# Patient Record
Sex: Female | Born: 1980 | Race: Black or African American | Hispanic: No | Marital: Single | Smoking: Current every day smoker
Health system: Southern US, Community
[De-identification: ages and names within clinical notes are randomized; demographics above are authoritative.]

## PROBLEM LIST (undated history)

## (undated) DIAGNOSIS — N912 Amenorrhea, unspecified: Secondary | ICD-10-CM

## (undated) DIAGNOSIS — N200 Calculus of kidney: Secondary | ICD-10-CM

## (undated) DIAGNOSIS — IMO0002 Reserved for concepts with insufficient information to code with codable children: Secondary | ICD-10-CM

---

## 2012-12-24 ENCOUNTER — Other Ambulatory Visit (HOSPITAL_COMMUNITY)
Admission: RE | Admit: 2012-12-24 | Discharge: 2012-12-24 | Disposition: A | Discharge status: Home / Self Care | Payer: Medicaid Other | Source: Ambulatory Visit | Attending: Family Medicine | Admitting: Family Medicine

## 2012-12-24 ENCOUNTER — Encounter (HOSPITAL_COMMUNITY): Payer: Self-pay | Admitting: *Deleted

## 2012-12-24 ENCOUNTER — Emergency Department (HOSPITAL_COMMUNITY)
Admission: EM | Admit: 2012-12-24 | Discharge: 2012-12-24 | Disposition: A | Discharge status: Home / Self Care | Payer: Medicaid Other | Source: Home / Self Care

## 2012-12-24 DIAGNOSIS — Z113 Encounter for screening for infections with a predominantly sexual mode of transmission: Secondary | ICD-10-CM | POA: Insufficient documentation

## 2012-12-24 DIAGNOSIS — N760 Acute vaginitis: Secondary | ICD-10-CM | POA: Insufficient documentation

## 2012-12-24 DIAGNOSIS — R109 Unspecified abdominal pain: Secondary | ICD-10-CM

## 2012-12-24 LAB — POCT URINALYSIS DIP (DEVICE)
Ketones, ur: NEGATIVE mg/dL
Protein, ur: NEGATIVE mg/dL
Specific Gravity, Urine: 1.02 (ref 1.005–1.030)
pH: 5.5 (ref 5.0–8.0)

## 2012-12-24 LAB — POCT PREGNANCY, URINE: Preg Test, Ur: NEGATIVE

## 2012-12-24 MED ORDER — FLUCONAZOLE 150 MG PO TABS: 150.0000 mg | ORAL_TABLET | Freq: Once | ORAL | Status: DC

## 2012-12-24 NOTE — ED Notes (Signed)
C/O intermittent low abd pains since last night.  Denies any n/v, vaginal discharge.  Has been intermittently taking amoxicillin over past 2 wks for dental infection.  Had diarrhea on Sat, but resolved.  Has been taking 800mg  IBU.

## 2012-12-24 NOTE — ED Provider Notes (Signed)
Suzanne Rush is a 32 y.o. female who presents to Urgent Care today for abdominal pain present since the last several days associated with some vaginal irritation and mild vaginal discharge. Patient recently has been taking amoxicillin for a dental infection. She is scheduled to have to surgery on Wednesday. She notes one day of mild diarrhea on Sunday. She's tried ibuprofen which has helped a bit. She denies any nausea or vomiting or constipation or blood in her stool. She notes that it has been sometime since her last menstrual period however she has been on Depo-Provera until recently. No fevers or chills. No sick contacts.    PMH reviewed. Healthy otherwise History  Substance Use Topics  . Smoking status: Never Smoker   . Smokeless tobacco: Not on file  . Alcohol Use: Yes     Comment: occasional   ROS as above Medications reviewed. No current facility-administered medications for this encounter.   Current Outpatient Prescriptions  Medication Sig Dispense Refill  . fluconazole (DIFLUCAN) 150 MG tablet Take 1 tablet (150 mg total) by mouth once.  1 tablet  1    Exam:  BP 118/68  Pulse 88  Temp(Src) 98.3 F (36.8 C) (Oral)  Resp 16  SpO2 95% Gen: Well NAD HEENT: EOMI,  MMM Lungs: CTABL Nl WOB Heart: RRR no MRG Abd: NABS, nondistended mildly tender left lower quadrant.  Exts: Non edematous BL  LE, warm and well perfused.  GYN: Slightly irritated external genitalia with white occasional discharge.  Vaginal canal with thick cheesy discharge.  Normal-appearing cervix.  No cervical motion tenderness no adnexal masses  Results for orders placed during the hospital encounter of 12/24/12 (from the past 24 hour(s))  POCT URINALYSIS DIP (DEVICE)     Status: Abnormal   Collection Time    12/24/12  7:04 PM      Result Value Range   Glucose, UA NEGATIVE  NEGATIVE mg/dL   Bilirubin Urine NEGATIVE  NEGATIVE   Ketones, ur NEGATIVE  NEGATIVE mg/dL   Specific Gravity, Urine 1.020  1.005  - 1.030   Hgb urine dipstick SMALL (*) NEGATIVE   pH 5.5  5.0 - 8.0   Protein, ur NEGATIVE  NEGATIVE mg/dL   Urobilinogen, UA 0.2  0.0 - 1.0 mg/dL   Nitrite NEGATIVE  NEGATIVE   Leukocytes, UA NEGATIVE  NEGATIVE  POCT PREGNANCY, URINE     Status: None   Collection Time    12/24/12  7:08 PM      Result Value Range   Preg Test, Ur NEGATIVE  NEGATIVE   No results found.  Assessment and Plan: 32 y.o. female with likely yeast infection.  Plan to treat with fluconazole. Additionally diarrhea may be due to amoxicillin.. Advised patient to discontinue amoxicillin is possible take probiotics.  Followup as needed.  Discussed warning signs or symptoms. Please see discharge instructions. Patient expresses understanding.      Rodolph Bong, MD 12/24/12 [phone]

## 2012-12-26 NOTE — ED Notes (Signed)
GC/Chlamydia neg., Affirm: Gardnerella pos., Candida and Trich neg.  Message sent to Dr. Denyse Amass. Vassie Moselle 12/26/2012

## 2012-12-31 ENCOUNTER — Telehealth (HOSPITAL_COMMUNITY): Payer: Self-pay | Admitting: Family Medicine

## 2012-12-31 MED ORDER — METRONIDAZOLE 500 MG PO TABS: 500.0000 mg | ORAL_TABLET | Freq: Two times a day (BID) | ORAL | Status: DC

## 2012-12-31 NOTE — ED Notes (Signed)
Pt. called back. Pt. verified x 2 and given results.  Pt. told she needs Flagyl for bacterial vaginosis.   Pt. instructed to no alcohol while taking this medication.  Pt. told she can pick it up at her pharmacy.  Pt. voiced understanding. Vassie Moselle 12/31/2012

## 2012-12-31 NOTE — ED Notes (Signed)
Called in flagyl.  Nurse will call patient.   Rodolph Bong, MD 12/31/12 [phone]

## 2012-12-31 NOTE — ED Notes (Signed)
Flagyl e-prescribed by Dr. Denyse Amass to [facility] on [address]  I called pt. and left a message to call Vassie Moselle 12/31/2012

## 2012-12-31 NOTE — Telephone Encounter (Signed)
Message copied by Rodolph Bong on Mon Dec 31, 2012  2:56 PM ------      Message from: Vassie Moselle      Created: Wed Dec 26, 2012  9:43 PM      Regarding: labs       Gardnerella pos.  Rest of labs neg.  Treated with Diflucan.      Vassie Moselle      12/26/2012       ------

## 2013-05-20 ENCOUNTER — Emergency Department (HOSPITAL_COMMUNITY)
Admission: EM | Admit: 2013-05-20 | Discharge: 2013-05-20 | Disposition: A | Discharge status: Home / Self Care | Payer: Medicaid Other | Source: Home / Self Care | Attending: Family Medicine | Admitting: Family Medicine

## 2013-05-20 ENCOUNTER — Encounter (HOSPITAL_COMMUNITY): Payer: Self-pay | Admitting: Emergency Medicine

## 2013-05-20 DIAGNOSIS — N200 Calculus of kidney: Secondary | ICD-10-CM

## 2013-05-20 LAB — POCT URINALYSIS DIP (DEVICE)
Ketones, ur: NEGATIVE mg/dL
Protein, ur: 100 mg/dL — AB
Specific Gravity, Urine: 1.015 (ref 1.005–1.030)

## 2013-05-20 LAB — POCT PREGNANCY, URINE: Preg Test, Ur: NEGATIVE

## 2013-05-20 MED ORDER — HYDROCODONE-ACETAMINOPHEN 5-325 MG PO TABS: 1.0000 | ORAL_TABLET | Freq: Four times a day (QID) | ORAL | Status: DC | PRN

## 2013-05-20 MED ORDER — CEPHALEXIN 500 MG PO CAPS: 500.0000 mg | ORAL_CAPSULE | Freq: Four times a day (QID) | ORAL | Status: DC

## 2013-05-20 MED ORDER — NAPROXEN 500 MG PO TABS: 500.0000 mg | ORAL_TABLET | Freq: Two times a day (BID) | ORAL | Status: DC | PRN

## 2013-05-20 MED ORDER — TAMSULOSIN HCL 0.4 MG PO CAPS: 0.4000 mg | ORAL_CAPSULE | Freq: Every day | ORAL | Status: DC

## 2013-05-20 NOTE — ED Notes (Signed)
Pt c/o intermittent lower back pain that radiate to bilateral sides... Pain increases w/acitivty  sxs also include: dysuria Denies: f/v/n/d, hematuria, gyn sxs She is alert w/no signs of acute distress.

## 2013-05-20 NOTE — ED Provider Notes (Signed)
Suzanne Rush is a 32 y.o. female who presents to Urgent Care today for Right flank pain starting yesterday. Patient notes moderate to severe right-sided with very mild left-sided flank pain. The pain radiates from her right flank to her right groin. She notes some urgency and frequency. She denies any nausea vomiting or diarrhea. She denies any vaginal discharge or bleeding.she has not tried any medications yet. The pain comes and goes and is severe at times. She notes sweating but denies any fever.   History reviewed. No pertinent past medical history. History  Substance Use Topics  . Smoking status: Never Smoker   . Smokeless tobacco: Not on file  . Alcohol Use: Yes     Comment: occasional   ROS as above Medications reviewed. No current facility-administered medications for this encounter.   Current Outpatient Prescriptions  Medication Sig Dispense Refill  . cephALEXin (KEFLEX) 500 MG capsule Take 1 capsule (500 mg total) by mouth 4 (four) times daily.  40 capsule  0  . HYDROcodone-acetaminophen (NORCO/VICODIN) 5-325 MG per tablet Take 1 tablet by mouth every 6 (six) hours as needed.  15 tablet  0  . naproxen (NAPROSYN) 500 MG tablet Take 1 tablet (500 mg total) by mouth 2 (two) times daily as needed for moderate pain.  30 tablet  0  . tamsulosin (FLOMAX) 0.4 MG CAPS capsule Take 1 capsule (0.4 mg total) by mouth daily.  30 capsule  0    Exam:  BP 113/76  Pulse 117  Temp(Src) 99.4 F (37.4 C) (Oral)  Resp 16  SpO2 100% Gen: Well NAD HEENT: EOMI,  MMM Lungs: Normal work of breathing. CTABL Heart: RRR no MRG Abd: NABS, Soft. NT, ND, no significant CVA tenderness to percussion Exts: Non edematous BL  LE, warm and well perfused.   Results for orders placed during the hospital encounter of 05/20/13 (from the past 24 hour(s))  POCT URINALYSIS DIP (DEVICE)     Status: Abnormal   Collection Time    05/20/13  8:52 AM      Result Value Range   Glucose, UA NEGATIVE  NEGATIVE mg/dL   Bilirubin Urine NEGATIVE  NEGATIVE   Ketones, ur NEGATIVE  NEGATIVE mg/dL   Specific Gravity, Urine 1.015  1.005 - 1.030   Hgb urine dipstick MODERATE (*) NEGATIVE   pH 5.5  5.0 - 8.0   Protein, ur 100 (*) NEGATIVE mg/dL   Urobilinogen, UA 0.2  0.0 - 1.0 mg/dL   Nitrite NEGATIVE  NEGATIVE   Leukocytes, UA SMALL (*) NEGATIVE  POCT PREGNANCY, URINE     Status: None   Collection Time    05/20/13  8:53 AM      Result Value Range   Preg Test, Ur NEGATIVE  NEGATIVE   No results found.  Assessment and Plan: 32 y.o. female with probable kidney stone. Plan for treatment with naproxen, Flomax, Norco, and Keflex. Urine culture is pending. Will present to the emergency room if worsening. Will followup with urology. Discussed warning signs or symptoms. Please see discharge instructions. Patient expresses understanding.      Rodolph Bong, MD 05/20/13 [phone]

## 2013-05-20 NOTE — ED Notes (Signed)
Urine strainer given to pt.

## 2013-05-22 LAB — URINE CULTURE: Special Requests: NORMAL

## 2013-05-22 NOTE — ED Notes (Signed)
Urine culture: >100,000 colonies E. Coli.  Pt. adequately treated with Keflex. Suzanne Rush M 05/22/2013  

## 2013-05-31 ENCOUNTER — Emergency Department (HOSPITAL_COMMUNITY)
Admission: EM | Admit: 2013-05-31 | Discharge: 2013-05-31 | Disposition: A | Discharge status: Home / Self Care | Payer: Medicaid Other | Source: Home / Self Care

## 2013-05-31 ENCOUNTER — Encounter (HOSPITAL_COMMUNITY): Payer: Self-pay | Admitting: Emergency Medicine

## 2013-05-31 DIAGNOSIS — R071 Chest pain on breathing: Secondary | ICD-10-CM

## 2013-05-31 DIAGNOSIS — M940 Chondrocostal junction syndrome [Tietze]: Secondary | ICD-10-CM

## 2013-05-31 DIAGNOSIS — R0789 Other chest pain: Secondary | ICD-10-CM

## 2013-05-31 HISTORY — DX: Calculus of kidney: N20.0

## 2013-05-31 MED ORDER — TRAMADOL HCL 50 MG PO TABS: 50.0000 mg | ORAL_TABLET | Freq: Four times a day (QID) | ORAL | Status: DC | PRN

## 2013-05-31 MED ORDER — DICLOFENAC POTASSIUM 50 MG PO TABS: 50.0000 mg | ORAL_TABLET | Freq: Three times a day (TID) | ORAL | Status: DC

## 2013-05-31 NOTE — Discharge Instructions (Signed)
Chest Wall Pain Chest wall pain is pain in or around the bones and muscles of your chest. It may take up to 6 weeks to get better. It may take longer if you must stay physically active in your work and activities.  CAUSES  Chest wall pain may happen on its own. However, it may be caused by:  A viral illness like the flu.  Injury.  Coughing.  Exercise.  Arthritis.  Fibromyalgia.  Shingles. HOME CARE INSTRUCTIONS   Avoid overtiring physical activity. Try not to strain or perform activities that cause pain. This includes any activities using your chest or your abdominal and side muscles, especially if heavy weights are used.  Put ice on the sore area.  Put ice in a plastic bag.  Place a towel between your skin and the bag.  Leave the ice on for 15-20 minutes per hour while awake for the first 2 days.  Only take over-the-counter or prescription medicines for pain, discomfort, or fever as directed by your caregiver. SEEK IMMEDIATE MEDICAL CARE IF:   Your pain increases, or you are very uncomfortable.  You have a fever.  Your chest pain becomes worse.  You have new, unexplained symptoms.  You have nausea or vomiting.  You feel sweaty or lightheaded.  You have a cough with phlegm (sputum), or you cough up blood. MAKE SURE YOU:   Understand these instructions.  Will watch your condition.  Will get help right away if you are not doing well or get worse. Document Released: 05/09/2005 Document Revised: 08/01/2011 Document Reviewed: 01/03/2011 [facility] Patient Information 2014 [place], [place].  Costochondritis Costochondritis (Tietze syndrome), or costochondral separation, is a swelling and irritation (inflammation) of the tissue (cartilage) that connects your ribs with your breastbone (sternum). It may occur on its own (spontaneously), through damage caused by an accident (trauma), or simply from coughing or minor exercise. It may take up to 6 weeks to get better and  longer if you are unable to be conservative in your activities. HOME CARE INSTRUCTIONS   Avoid exhausting physical activity. Try not to strain your ribs during normal activity. This would include any activities using chest, belly (abdominal), and side muscles, especially if heavy weights are used.  Use ice for 15-20 minutes per hour while awake for the first 2 days. Place the ice in a plastic bag, and place a towel between the bag of ice and your skin.  Only take over-the-counter or prescription medicines for pain, discomfort, or fever as directed by your caregiver. SEEK IMMEDIATE MEDICAL CARE IF:   Your pain increases or you are very uncomfortable.  You have a fever.  You develop difficulty with your breathing.  You cough up blood.  You develop worse chest pains, shortness of breath, sweating, or vomiting.  You develop new, unexplained problems (symptoms). MAKE SURE YOU:   Understand these instructions.  Will watch your condition.  Will get help right away if you are not doing well or get worse. Document Released: 02/16/2005 Document Revised: 08/01/2011 Document Reviewed: 12/11/2012 [facility] Patient Information 2014 [place], [place].

## 2013-05-31 NOTE — ED Notes (Signed)
states she began to have chest pain at work aprox  7:30 Am . Pain worse w direct palpation to chest wall, worse w deep breathing; NAD at present; "under a lot of stress"

## 2013-05-31 NOTE — ED Provider Notes (Signed)
CSN: [id number]     Arrival date & time 05/31/13  0902 History   First MD Initiated Contact with Patient 05/31/13 0919     Chief Complaint  Patient presents with  . Chest Pain   (Consider location/radiation/quality/duration/timing/severity/associated sxs/prior Treatment) HPI Comments: 33 year old female states she was at work today standing in performing her usual duties when she experienced acute right-sided chest pain. She knows that the pain was worse with movement and taking a deep breath.   Past Medical History  Diagnosis Date  . Kidney stones    Past Surgical History  Procedure Laterality Date  . Cesarean section     History reviewed. No pertinent family history. History  Substance Use Topics  . Smoking status: Current Every Day Smoker  . Smokeless tobacco: Not on file  . Alcohol Use: Yes     Comment: occasional   OB History   Grav Para Term Preterm Abortions TAB SAB Ect Mult Living                 Review of Systems  Constitutional: Positive for activity change. Negative for fever, chills and diaphoresis.  HENT: Negative.   Respiratory: Negative.  Negative for shortness of breath and wheezing.   Cardiovascular: Positive for chest pain and palpitations. Negative for leg swelling.  Gastrointestinal: Negative.   Musculoskeletal: Negative.   Skin: Negative.     Allergies  Review of patient's allergies indicates no known allergies.  Home Medications   Current Outpatient Rx  Name  Route  Sig  Dispense  Refill  . cephALEXin (KEFLEX) 500 MG capsule   Oral   Take 1 capsule (500 mg total) by mouth 4 (four) times daily.   40 capsule   0   . diclofenac (CATAFLAM) 50 MG tablet   Oral   Take 1 tablet (50 mg total) by mouth 3 (three) times daily.   21 tablet   0   . HYDROcodone-acetaminophen (NORCO/VICODIN) 5-325 MG per tablet   Oral   Take 1 tablet by mouth every 6 (six) hours as needed.   15 tablet   0   . naproxen (NAPROSYN) 500 MG tablet   Oral   Take 1  tablet (500 mg total) by mouth 2 (two) times daily as needed for moderate pain.   30 tablet   0   . tamsulosin (FLOMAX) 0.4 MG CAPS capsule   Oral   Take 1 capsule (0.4 mg total) by mouth daily.   30 capsule   0   . traMADol (ULTRAM) 50 MG tablet   Oral   Take 1 tablet (50 mg total) by mouth every 6 (six) hours as needed.   15 tablet   0    BP 121/78  Pulse 87  Temp(Src) 98.1 F (36.7 C) (Oral)  Resp 14  SpO2 100% Physical Exam  Nursing note and vitals reviewed. Constitutional: She is oriented to person, place, and time. She appears well-developed and well-nourished. No distress.  obese  Neck: Normal range of motion. Neck supple.  Cardiovascular: Normal rate, regular rhythm and normal heart sounds.   No murmur heard. Pulmonary/Chest: Effort normal and breath sounds normal. No respiratory distress. She has no wheezes. She has no rales.  Moderate to severe tendrness upon palpating the R parasternal border and R upper anterior chest. This reproduces exact same pain for which she presented. Test perform multiple times with same outcome.  Abdominal: Soft. There is no tenderness.  Musculoskeletal: Normal range of motion. She exhibits tenderness. She  exhibits no edema.  Lymphadenopathy:    She has no cervical adenopathy.  Neurological: She is alert and oriented to person, place, and time. She exhibits normal muscle tone.  Skin: Skin is warm and dry.  Psychiatric: She has a normal mood and affect.    ED Course  Procedures (including critical care time) Labs Review Labs Reviewed - No data to display Imaging Review  EKG: NSR, no ectopy, S-T elevation in II, III and precordial leads consistent with early repol. Examined by Dr. Artis FlockKindl.  MDM   1. Chest wall pain   2. Costochondritis, acute    Diclofenac as dir Tramadol as dir Apply ice locally prn F/U with your PCP      Hayden Rasmussenavid Miciah Covelli, NP 05/31/13 1006  Hayden Rasmussenavid Joaopedro Eschbach, NP 05/31/13 1017

## 2013-06-03 NOTE — ED Provider Notes (Signed)
Medical screening examination/treatment/procedure(s) were performed by resident physician or non-physician practitioner and as supervising physician I was immediately available for consultation/collaboration.   Barkley BrunsKINDL,Kinberly Perris DOUGLAS MD.   Linna HoffJames D Ahtziri Jeffries, MD 06/03/13 [phone]

## 2013-09-29 ENCOUNTER — Emergency Department (INDEPENDENT_AMBULATORY_CARE_PROVIDER_SITE_OTHER)
Admission: EM | Admit: 2013-09-29 | Discharge: 2013-09-29 | Disposition: A | Discharge status: Home / Self Care | Payer: Self-pay | Source: Home / Self Care | Attending: Emergency Medicine | Admitting: Emergency Medicine

## 2013-09-29 ENCOUNTER — Encounter (HOSPITAL_COMMUNITY): Payer: Self-pay | Admitting: Emergency Medicine

## 2013-09-29 DIAGNOSIS — N912 Amenorrhea, unspecified: Secondary | ICD-10-CM

## 2013-09-29 DIAGNOSIS — R112 Nausea with vomiting, unspecified: Secondary | ICD-10-CM

## 2013-09-29 DIAGNOSIS — N390 Urinary tract infection, site not specified: Secondary | ICD-10-CM

## 2013-09-29 HISTORY — DX: Reserved for concepts with insufficient information to code with codable children: IMO0002

## 2013-09-29 HISTORY — DX: Amenorrhea, unspecified: N91.2

## 2013-09-29 LAB — CBC WITH DIFFERENTIAL/PLATELET
BASOS ABS: 0 10*3/uL (ref 0.0–0.1)
Basophils Relative: 0 % (ref 0–1)
Eosinophils Absolute: 0.1 10*3/uL (ref 0.0–0.7)
Eosinophils Relative: 1 % (ref 0–5)
HEMATOCRIT: 35.7 % — AB (ref 36.0–46.0)
HEMOGLOBIN: 12.2 g/dL (ref 12.0–15.0)
LYMPHS PCT: 30 % (ref 12–46)
Lymphs Abs: 2 10*3/uL (ref 0.7–4.0)
MCH: 30.3 pg (ref 26.0–34.0)
MCHC: 34.2 g/dL (ref 30.0–36.0)
MCV: 88.6 fL (ref 78.0–100.0)
MONO ABS: 0.3 10*3/uL (ref 0.1–1.0)
Monocytes Relative: 5 % (ref 3–12)
NEUTROS PCT: 64 % (ref 43–77)
Neutro Abs: 4.3 10*3/uL (ref 1.7–7.7)
Platelets: 285 10*3/uL (ref 150–400)
RBC: 4.03 MIL/uL (ref 3.87–5.11)
RDW: 13.4 % (ref 11.5–15.5)
WBC: 6.7 10*3/uL (ref 4.0–10.5)

## 2013-09-29 LAB — POCT URINALYSIS DIP (DEVICE)
BILIRUBIN URINE: NEGATIVE
GLUCOSE, UA: NEGATIVE mg/dL
KETONES UR: NEGATIVE mg/dL
Nitrite: POSITIVE — AB
PH: 6 (ref 5.0–8.0)
Protein, ur: NEGATIVE mg/dL
Specific Gravity, Urine: 1.01 (ref 1.005–1.030)
Urobilinogen, UA: 0.2 mg/dL (ref 0.0–1.0)

## 2013-09-29 LAB — POCT I-STAT, CHEM 8
BUN: 7 mg/dL (ref 6–23)
CALCIUM ION: 1.18 mmol/L (ref 1.12–1.23)
CREATININE: 0.8 mg/dL (ref 0.50–1.10)
Chloride: 105 mEq/L (ref 96–112)
Glucose, Bld: 113 mg/dL — ABNORMAL HIGH (ref 70–99)
HCT: 41 % (ref 36.0–46.0)
Hemoglobin: 13.9 g/dL (ref 12.0–15.0)
Potassium: 3.5 mEq/L — ABNORMAL LOW (ref 3.7–5.3)
Sodium: 146 mEq/L (ref 137–147)
TCO2: 27 mmol/L (ref 0–100)

## 2013-09-29 LAB — POCT PREGNANCY, URINE: PREG TEST UR: NEGATIVE

## 2013-09-29 LAB — POCT H PYLORI SCREEN: H. PYLORI SCREEN, POC: NEGATIVE

## 2013-09-29 LAB — TSH: TSH: 0.885 u[IU]/mL (ref 0.350–4.500)

## 2013-09-29 MED ORDER — METOCLOPRAMIDE HCL 10 MG PO TABS: 10.0000 mg | ORAL_TABLET | Freq: Three times a day (TID) | ORAL | Status: DC

## 2013-09-29 MED ORDER — RANITIDINE HCL 150 MG PO TABS: 150.0000 mg | ORAL_TABLET | Freq: Two times a day (BID) | ORAL | Status: DC

## 2013-09-29 MED ORDER — CEPHALEXIN 500 MG PO CAPS: 500.0000 mg | ORAL_CAPSULE | Freq: Three times a day (TID) | ORAL | Status: DC

## 2013-09-29 MED ORDER — FLUCONAZOLE 150 MG PO TABS: 150.0000 mg | ORAL_TABLET | Freq: Once | ORAL | Status: DC

## 2013-09-29 NOTE — Progress Notes (Signed)
Quick Note:  Test result was normal. No further action is needed at this time.Patient was called and informed of result. ______

## 2013-09-29 NOTE — ED Notes (Signed)
Reports started 14-day course Provera 2 wks ago for amenorrhea; pt trying to conceive.  Since Tuesday, has had nausea, vomiting episodes, HAs, frequent urination.  Took home preg test Tues - was negative.

## 2013-09-29 NOTE — Discharge Instructions (Signed)
Nausea and Vomiting Nausea is a sick feeling that often comes before throwing up (vomiting). Vomiting is a reflex where stomach contents come out of your mouth. Vomiting can cause severe loss of body fluids (dehydration). Children and elderly adults can become dehydrated quickly, especially if they also have diarrhea. Nausea and vomiting are symptoms of a condition or disease. It is important to find the cause of your symptoms. CAUSES   Direct irritation of the stomach lining. This irritation can result from increased acid production (gastroesophageal reflux disease), infection, food poisoning, taking certain medicines (such as nonsteroidal anti-inflammatory drugs), alcohol use, or tobacco use.  Signals from the brain.These signals could be caused by a headache, heat exposure, an inner ear disturbance, increased pressure in the brain from injury, infection, a tumor, or a concussion, pain, emotional stimulus, or metabolic problems.  An obstruction in the gastrointestinal tract (bowel obstruction).  Illnesses such as diabetes, hepatitis, gallbladder problems, appendicitis, kidney problems, cancer, sepsis, atypical symptoms of a heart attack, or eating disorders.  Medical treatments such as chemotherapy and radiation.  Receiving medicine that makes you sleep (general anesthetic) during surgery. DIAGNOSIS Your caregiver may ask for tests to be done if the problems do not improve after a few days. Tests may also be done if symptoms are severe or if the reason for the nausea and vomiting is not clear. Tests may include:  Urine tests.  Blood tests.  Stool tests.  Cultures (to look for evidence of infection).  X-rays or other imaging studies. Test results can help your caregiver make decisions about treatment or the need for additional tests. TREATMENT You need to stay well hydrated. Drink frequently but in small amounts.You may wish to drink water, sports drinks, clear broth, or eat frozen  ice pops or gelatin dessert to help stay hydrated.When you eat, eating slowly may help prevent nausea.There are also some antinausea medicines that may help prevent nausea. HOME CARE INSTRUCTIONS   Take all medicine as directed by your caregiver.  If you do not have an appetite, do not force yourself to eat. However, you must continue to drink fluids.  If you have an appetite, eat a normal diet unless your caregiver tells you differently.  Eat a variety of complex carbohydrates (rice, wheat, potatoes, bread), lean meats, yogurt, fruits, and vegetables.  Avoid high-fat foods because they are more difficult to digest.  Drink enough water and fluids to keep your urine clear or pale yellow.  If you are dehydrated, ask your caregiver for specific rehydration instructions. Signs of dehydration may include:  Severe thirst.  Dry lips and mouth.  Dizziness.  Dark urine.  Decreasing urine frequency and amount.  Confusion.  Rapid breathing or pulse. SEEK IMMEDIATE MEDICAL CARE IF:   You have blood or brown flecks (like coffee grounds) in your vomit.  You have black or bloody stools.  You have a severe headache or stiff neck.  You are confused.  You have severe abdominal pain.  You have chest pain or trouble breathing.  You do not urinate at least once every 8 hours.  You develop cold or clammy skin.  You continue to vomit for longer than 24 to 48 hours.  You have a fever. MAKE SURE YOU:   Understand these instructions.  Will watch your condition.  Will get help right away if you are not doing well or get worse. Document Released: 05/09/2005 Document Revised: 08/01/2011 Document Reviewed: 10/06/2010 [facility] Patient Information 2014 [place], [place]. Secondary Amenorrhea  Secondary amenorrhea  is the stopping of menstrual flow for 3 6 months in a female who has previously had periods. There are many possible causes. Most of these causes are not serious. Usually,  treating the underlying problem causing the loss of menses will return your periods to normal. CAUSES  Some common and uncommon causes of not menstruating include:  Malnutrition.  Low blood sugar (hypoglycemia).  Polycystic ovary disease.  Stress or fear.  Breastfeeding.  Hormone imbalance.  Ovarian failure.  Medicines.  Extreme obesity.  Cystic fibrosis.  Low body weight or drastic weight reduction from any cause.  Early menopause.  Removal of ovaries or uterus.  Contraceptives.  Illness.  Long-term (chronic) illnesses.  Cushing syndrome.  Thyroid problems.  Birth control pills, patches, or vaginal rings for birth control. RISK FACTORS You may be at greater risk of secondary amenorrhea if:  You have a family history of this condition.  You have an eating disorder.  You do athletic training. DIAGNOSIS  A diagnosis is made by your health care provider taking a medical history and doing a physical exam. This will include a pelvic exam to check for problems with your reproductive organs. Pregnancy must be ruled out. Often, numerous blood tests are done to measure different hormones in the body. Urine testing may be done. Specialized exams (ultrasound, CT scan, MRI, or hysteroscopy) may have to be done as well as measuring the body mass index (BMI). TREATMENT  Treatment depends on the cause of the amenorrhea. If an eating disorder is present, this can be treated with an adequate diet and therapy. Chronic illnesses may improve with treatment of the illness. Amenorrhea may be corrected with medicines, lifestyle changes, or surgery. If the amenorrhea cannot be corrected, it is sometimes possible to create a false menstruation with medicines. HOME CARE INSTRUCTIONS  Maintain a healthy diet.  Manage weight problems.  Exercise regularly but not excessively.  Get adequate sleep.  Manage stress.  Be aware of changes in your menstrual cycle. Keep a record of when  your periods occur. Note the date your period starts, how long it lasts, and any problems. SEEK MEDICAL CARE IF: Your symptoms do not get better with treatment. Document Released: 06/20/2006 Document Revised: 01/09/2013 Document Reviewed: 10/25/2012 [facility] Patient Information 2014 [place], [place].  Urinary Tract Infection Urinary tract infections (UTIs) can develop anywhere along your urinary tract. Your urinary tract is your body's drainage system for removing wastes and extra water. Your urinary tract includes two kidneys, two ureters, a bladder, and a urethra. Your kidneys are a pair of bean-shaped organs. Each kidney is about the size of your fist. They are located below your ribs, one on each side of your spine. CAUSES Infections are caused by microbes, which are microscopic organisms, including fungi, viruses, and bacteria. These organisms are so small that they can only be seen through a microscope. Bacteria are the microbes that most commonly cause UTIs. SYMPTOMS  Symptoms of UTIs may vary by age and gender of the patient and by the location of the infection. Symptoms in young women typically include a frequent and intense urge to urinate and a painful, burning feeling in the bladder or urethra during urination. Older women and men are more likely to be tired, shaky, and weak and have muscle aches and abdominal pain. A fever may mean the infection is in your kidneys. Other symptoms of a kidney infection include pain in your back or sides below the ribs, nausea, and vomiting. DIAGNOSIS To diagnose a UTI,  your caregiver will ask you about your symptoms. Your caregiver also will ask to provide a urine sample. The urine sample will be tested for bacteria and white blood cells. White blood cells are made by your body to help fight infection. TREATMENT  Typically, UTIs can be treated with medication. Because most UTIs are caused by a bacterial infection, they usually can be treated with the use of  antibiotics. The choice of antibiotic and length of treatment depend on your symptoms and the type of bacteria causing your infection. HOME CARE INSTRUCTIONS  If you were prescribed antibiotics, take them exactly as your caregiver instructs you. Finish the medication even if you feel better after you have only taken some of the medication.  Drink enough water and fluids to keep your urine clear or pale yellow.  Avoid caffeine, tea, and carbonated beverages. They tend to irritate your bladder.  Empty your bladder often. Avoid holding urine for long periods of time.  Empty your bladder before and after sexual intercourse.  After a bowel movement, women should cleanse from front to back. Use each tissue only once. SEEK MEDICAL CARE IF:   You have back pain.  You develop a fever.  Your symptoms do not begin to resolve within 3 days. SEEK IMMEDIATE MEDICAL CARE IF:   You have severe back pain or lower abdominal pain.  You develop chills.  You have nausea or vomiting.  You have continued burning or discomfort with urination. MAKE SURE YOU:   Understand these instructions.  Will watch your condition.  Will get help right away if you are not doing well or get worse. Document Released: 02/16/2005 Document Revised: 11/08/2011 Document Reviewed: 06/17/2011 [facility] Patient Information 2014 [place], [place].

## 2013-09-29 NOTE — ED Provider Notes (Signed)
Chief Complaint   Chief Complaint  Patient presents with  . Emesis  . Headache    History of Present Illness   Aviva SignsShawana Grandmaison is a 33 year old female who has had a six-day history of nausea and vomiting after meals. She gets nauseated with even strong smells of food cooking she has frequent salivation. She's had headaches, but denies any fever or chills. She denies any abdominal pain at all. She's had some urinary frequency but denies urgency, dysuria, or hematuria. She's had no upper lower back pain. She has no history of ulcer disease, gastritis, gallstones, hepatitis, colitis, or diverticulitis.  The patient also has had problems with her menstrual periods. Her last menstrual period was in 2011. She was on Depo-Provera from 2011 through 2013. She stopped the Depo-Provera 2013 and is now trying to become pregnant. She has not had a normal menstrual period since she stopped the Depo-Provera. [place]. He prescribed Provera by mouth for 10 days to get her period started. She finished this up but has not started her period yet. She had a negative pregnancy test a week ago.  Review of Systems   Other than as noted above, the patient denies any of the following symptoms: Constitutional:  No fever, chills, weight loss or anorexia. Abdomen:  No nausea, vomiting, hematememesis, melena, diarrhea, or hematochezia. GU:  No dysuria, frequency, urgency, or hematuria. Gyn:  No vaginal discharge, itching, abnormal bleeding, dyspareunia, or pelvic pain.  PMFSH   Past medical history, family history, social history, meds, and allergies were reviewed.   Physical Exam     Vital signs:  BP 107/82  Pulse 107  Temp(Src) 98.4 F (36.9 C) (Oral)  Resp 16  SpO2 100% Gen:  Alert, oriented, in no distress. Lungs:  Breath sounds clear and equal bilaterally.  No wheezes, rales or rhonchi. Heart:  Regular rhythm.  No gallops or murmers.   Abdomen:   Soft, flat, nondistended. No organomegaly or mass. Bowel sounds were normally active. Skin:  Clear, warm and dry.  No rash.  Labs   Results for orders placed during the hospital encounter of 09/29/13  TSH      Result Value Ref Range   TSH 0.885  0.350 - 4.500 uIU/mL  CBC WITH DIFFERENTIAL      Result Value Ref Range   WBC 6.7  4.0 - 10.5 K/uL   RBC 4.03  3.87 - 5.11 MIL/uL   Hemoglobin 12.2  12.0 - 15.0 g/dL   HCT 69.635.7 (*) 29.536.0 - 28.446.0 %   MCV 88.6  78.0 - 100.0 fL   MCH 30.3  26.0 - 34.0 pg   MCHC 34.2  30.0 - 36.0 g/dL   RDW 13.213.4  44.011.5 - 10.215.5 %   Platelets 285  150 - 400 K/uL   Neutrophils Relative % 64  43 - 77 %   Neutro Abs 4.3  1.7 - 7.7 K/uL   Lymphocytes Relative 30  12 - 46 %   Lymphs Abs 2.0  0.7 - 4.0 K/uL   Monocytes Relative 5  3 - 12 %   Monocytes Absolute 0.3  0.1 - 1.0 K/uL   Eosinophils Relative 1  0 - 5 %   Eosinophils Absolute 0.1  0.0 - 0.7 K/uL   Basophils Relative 0  0 - 1 %   Basophils Absolute 0.0  0.0 - 0.1 K/uL  POCT URINALYSIS DIP (DEVICE)      Result  Value Ref Range   Glucose, UA NEGATIVE  NEGATIVE mg/dL   Bilirubin Urine NEGATIVE  NEGATIVE   Ketones, ur NEGATIVE  NEGATIVE mg/dL   Specific Gravity, Urine 1.010  1.005 - 1.030   Hgb urine dipstick SMALL (*) NEGATIVE   pH 6.0  5.0 - 8.0   Protein, ur NEGATIVE  NEGATIVE mg/dL   Urobilinogen, UA 0.2  0.0 - 1.0 mg/dL   Nitrite POSITIVE (*) NEGATIVE   Leukocytes, UA SMALL (*) NEGATIVE  POCT PREGNANCY, URINE      Result Value Ref Range   Preg Test, Ur NEGATIVE  NEGATIVE  POCT I-STAT, CHEM 8      Result Value Ref Range   Sodium 146  137 - 147 mEq/L   Potassium 3.5 (*) 3.7 - 5.3 mEq/L   Chloride 105  96 - 112 mEq/L   BUN 7  6 - 23 mg/dL   Creatinine, Ser 1.30  0.50 - 1.10 mg/dL   Glucose, Bld 865 (*) 70 - 99 mg/dL   Calcium, Ion 7.84  6.96 - 1.23 mmol/L   TCO2 27  0 - 100 mmol/L   Hemoglobin 13.9  12.0 - 15.0 g/dL   HCT 29.5  28.4 - 13.2 %  POCT H PYLORI SCREEN      Result Value Ref Range    H. PYLORI SCREEN, POC NEGATIVE  NEGATIVE   Assessment   The primary encounter diagnosis was Nausea and vomiting. Diagnoses of Amenorrhea and UTI (lower urinary tract infection) were also pertinent to this visit.  The nausea and vomiting could be due to gastritis, gastroenteritis, or ulcer disease. It's also possible it could be related to urinary tract infection. For right now we will treat with ranitidine and Reglan. These would both be safe in pregnancy. If no improvement after one to 2 weeks, suggested followup with gastroenterology.  Appears also had secondary amenorrhea. Cause of this could be ovarian failure, pituitary tumor, or a uterine problem. She will need to followup with her gynecologist.  Plan     1.  Meds:  The following meds were prescribed:   Discharge Medication List as of 09/29/2013  5:35 PM    START taking these medications   Details  !! cephALEXin (KEFLEX) 500 MG capsule Take 1 capsule (500 mg total) by mouth 3 (three) times daily., Starting 09/29/2013, Until Discontinued, Normal    fluconazole (DIFLUCAN) 150 MG tablet Take 1 tablet (150 mg total) by mouth once., Starting 09/29/2013, Normal    metoCLOPramide (REGLAN) 10 MG tablet Take 1 tablet (10 mg total) by mouth 4 (four) times daily -  before meals and at bedtime., Starting 09/29/2013, Until Discontinued, Normal    ranitidine (ZANTAC) 150 MG tablet Take 1 tablet (150 mg total) by mouth 2 (two) times daily., Starting 09/29/2013, Until Discontinued, Normal     !! - Potential duplicate medications found. Please discuss with provider.      2.  Patient Education/Counseling:  The patient was given appropriate handouts, self care instructions, and instructed in symptomatic relief.    3.  Follow up:  The patient was told to follow up here if no better in 3 to 4 days, or sooner if becoming worse in any way, and given some red flag symptoms such as worsening pain, fever, vomiting, or evidence of GI bleeding which would prompt  immediate return.  Follow up here as necessary. She is to see a gynecologist within the next week and to followup with Dr. Erick Blinks if her nausea  and vomiting has not improved after one to 2 weeks.    Reuben Likesavid C Jahmal Dunavant, MD 09/29/13 [phone]

## 2013-10-01 LAB — URINE CULTURE: Colony Count: >=100000

## 2013-10-01 NOTE — ED Notes (Signed)
Urine culture:>100,000 colonies E. Coli.  Pt. adequately treated with Keflex.  TSH 0.885.  Dr. Lorenz CoasterKeller called and notified pt. of this result. Suzanne Rush 10/01/2013

## 2013-10-01 NOTE — Progress Notes (Signed)
Quick Note:  Results are abnormal as noted, but have been adequately treated. No further action necessary. ______ 

## 2013-10-07 ENCOUNTER — Encounter (HOSPITAL_COMMUNITY): Payer: Self-pay | Admitting: Emergency Medicine

## 2013-10-07 ENCOUNTER — Emergency Department (HOSPITAL_COMMUNITY)
Admission: EM | Admit: 2013-10-07 | Discharge: 2013-10-07 | Disposition: A | Discharge status: Home / Self Care | Payer: Self-pay | Attending: Emergency Medicine | Admitting: Emergency Medicine

## 2013-10-07 DIAGNOSIS — F17200 Nicotine dependence, unspecified, uncomplicated: Secondary | ICD-10-CM | POA: Insufficient documentation

## 2013-10-07 DIAGNOSIS — R1110 Vomiting, unspecified: Secondary | ICD-10-CM | POA: Insufficient documentation

## 2013-10-07 DIAGNOSIS — Z3202 Encounter for pregnancy test, result negative: Secondary | ICD-10-CM | POA: Insufficient documentation

## 2013-10-07 DIAGNOSIS — N390 Urinary tract infection, site not specified: Secondary | ICD-10-CM

## 2013-10-07 DIAGNOSIS — Z8742 Personal history of other diseases of the female genital tract: Secondary | ICD-10-CM | POA: Insufficient documentation

## 2013-10-07 DIAGNOSIS — Z87442 Personal history of urinary calculi: Secondary | ICD-10-CM | POA: Insufficient documentation

## 2013-10-07 LAB — CBC WITH DIFFERENTIAL/PLATELET
BASOS PCT: 0 % (ref 0–1)
Basophils Absolute: 0 10*3/uL (ref 0.0–0.1)
EOS ABS: 0.3 10*3/uL (ref 0.0–0.7)
Eosinophils Relative: 5 % (ref 0–5)
HEMATOCRIT: 34.4 % — AB (ref 36.0–46.0)
HEMOGLOBIN: 12 g/dL (ref 12.0–15.0)
Lymphocytes Relative: 37 % (ref 12–46)
Lymphs Abs: 2.1 10*3/uL (ref 0.7–4.0)
MCH: 30.6 pg (ref 26.0–34.0)
MCHC: 34.9 g/dL (ref 30.0–36.0)
MCV: 87.8 fL (ref 78.0–100.0)
MONO ABS: 0.3 10*3/uL (ref 0.1–1.0)
MONOS PCT: 6 % (ref 3–12)
NEUTROS ABS: 3 10*3/uL (ref 1.7–7.7)
Neutrophils Relative %: 52 % (ref 43–77)
Platelets: 271 10*3/uL (ref 150–400)
RBC: 3.92 MIL/uL (ref 3.87–5.11)
RDW: 13.1 % (ref 11.5–15.5)
WBC: 5.7 10*3/uL (ref 4.0–10.5)

## 2013-10-07 LAB — COMPREHENSIVE METABOLIC PANEL
ALBUMIN: 3.7 g/dL (ref 3.5–5.2)
ALT: 14 U/L (ref 0–35)
AST: 20 U/L (ref 0–37)
Alkaline Phosphatase: 77 U/L (ref 39–117)
BILIRUBIN TOTAL: 0.2 mg/dL — AB (ref 0.3–1.2)
BUN: 8 mg/dL (ref 6–23)
CHLORIDE: 101 meq/L (ref 96–112)
CO2: 27 mEq/L (ref 19–32)
CREATININE: 0.62 mg/dL (ref 0.50–1.10)
Calcium: 9.1 mg/dL (ref 8.4–10.5)
GFR calc Af Amer: >90 mL/min (ref >90)
GFR calc non Af Amer: >90 mL/min (ref >90)
Glucose, Bld: 94 mg/dL (ref 70–99)
Potassium: 3.2 mEq/L — ABNORMAL LOW (ref 3.7–5.3)
Sodium: 140 mEq/L (ref 137–147)
Total Protein: 7.7 g/dL (ref 6.0–8.3)

## 2013-10-07 LAB — URINALYSIS, ROUTINE W REFLEX MICROSCOPIC
BILIRUBIN URINE: NEGATIVE
Glucose, UA: NEGATIVE mg/dL
Ketones, ur: NEGATIVE mg/dL
NITRITE: POSITIVE — AB
Protein, ur: NEGATIVE mg/dL
SPECIFIC GRAVITY, URINE: 1.01 (ref 1.005–1.030)
UROBILINOGEN UA: 1 mg/dL (ref 0.0–1.0)
pH: 6 (ref 5.0–8.0)

## 2013-10-07 LAB — POC URINE PREG, ED: PREG TEST UR: NEGATIVE

## 2013-10-07 LAB — URINE MICROSCOPIC-ADD ON

## 2013-10-07 LAB — LIPASE, BLOOD: LIPASE: 35 U/L (ref 11–59)

## 2013-10-07 MED ORDER — ONDANSETRON HCL 4 MG/2ML IJ SOLN
4.0000 mg | Freq: Once | INTRAMUSCULAR | Status: AC
  Administered 2013-10-07: 4 mg via INTRAVENOUS
  Filled 2013-10-07: qty 2

## 2013-10-07 MED ORDER — DEXTROSE 5 % IV SOLN
1.0000 g | Freq: Once | INTRAVENOUS | Status: AC
  Administered 2013-10-07: 1 g via INTRAVENOUS
  Filled 2013-10-07: qty 10

## 2013-10-07 MED ORDER — NITROFURANTOIN MONOHYD MACRO 100 MG PO CAPS: 100.0000 mg | ORAL_CAPSULE | Freq: Two times a day (BID) | ORAL | Status: DC

## 2013-10-07 MED ORDER — ONDANSETRON HCL 4 MG PO TABS: 4.0000 mg | ORAL_TABLET | Freq: Four times a day (QID) | ORAL | Status: AC

## 2013-10-07 NOTE — ED Provider Notes (Signed)
CSN: [id number]     Arrival date & time 10/07/13  1118 History   First MD Initiated Contact with Patient 10/07/13 1459     Chief Complaint  Patient presents with  . Emesis  . Abdominal Pain     (Consider location/radiation/quality/duration/timing/severity/associated sxs/prior Treatment) HPI Comments: Patient presents with a chief complaint of abdominal pain, nausea, and vomiting.  She reports that she has been feeling nauseous and has had intermittent vomiting over the past 12 days.  She reports that today she developed suprapubic abdominal pressure and cramping.  [facility] and was diagnosed with a UTI.  She was given a prescription for Keflex, but never had it filled.  She reports that her last episode of vomiting was a couple of days ago.  She denies fever, chills, or flank pain.  Denies vaginal discharge or vaginal bleeding.  Denies diarrhea.  Last BM was yesterday.  She denies dysuria or urgency.  She does report some increased urinary frequency.  Patient is a 33 y.o. female presenting with vomiting and abdominal pain. The history is provided by the patient.  Emesis Associated symptoms: abdominal pain   Abdominal Pain Associated symptoms: vomiting     Past Medical History  Diagnosis Date  . Kidney stones   . Infertility   . Amenorrhea    Past Surgical History  Procedure Laterality Date  . Cesarean section     No family history on file. History  Substance Use Topics  . Smoking status: Current Every Day Smoker    Types: Cigarettes  . Smokeless tobacco: Not on file  . Alcohol Use: Yes     Comment: occasional   OB History   Grav Para Term Preterm Abortions TAB SAB Ect Mult Living                 Review of Systems  Gastrointestinal: Positive for vomiting and abdominal pain.  All other systems reviewed and are negative.     Allergies  Review of patient's allergies indicates no known allergies.  Home Medications   Prior to Admission medications    Medication Sig Start Date End Date Taking? Authorizing Provider  ibuprofen (ADVIL,MOTRIN) 200 MG tablet Take 600 mg by mouth every 6 (six) hours as needed for moderate pain.   Yes Historical Provider, MD  Multiple Vitamin (MULTIVITAMIN WITH MINERALS) TABS tablet Take 1 tablet by mouth daily.   Yes Historical Provider, MD   BP 116/79  Pulse 71  Temp(Src) 97.5 F (36.4 C) (Oral)  Resp 18  SpO2 96% Physical Exam  Nursing note and vitals reviewed. Constitutional: She appears well-developed and well-nourished.  HENT:  Head: Normocephalic and atraumatic.  Neck: Normal range of motion. Neck supple.  Cardiovascular: Normal rate, regular rhythm and normal heart sounds.   Pulmonary/Chest: Effort normal and breath sounds normal.  Abdominal: Soft. Bowel sounds are normal. She exhibits no distension and no mass. There is tenderness. There is no rebound, no guarding and no CVA tenderness.  Mild suprapubic tenderness to palpation  Neurological: She is alert.  Skin: Skin is warm and dry.  Psychiatric: She has a normal mood and affect.    ED Course  Procedures (including critical care time) Labs Review Labs Reviewed  CBC WITH DIFFERENTIAL - Abnormal; Notable for the following:    HCT 34.4 (*)    All other components within normal limits  COMPREHENSIVE METABOLIC PANEL - Abnormal; Notable for the following:    Potassium 3.2 (*)  Total Bilirubin 0.2 (*)    All other components within normal limits  URINALYSIS, ROUTINE W REFLEX MICROSCOPIC - Abnormal; Notable for the following:    APPearance CLOUDY (*)    Hgb urine dipstick SMALL (*)    Nitrite POSITIVE (*)    Leukocytes, UA SMALL (*)    All other components within normal limits  URINE MICROSCOPIC-ADD ON - Abnormal; Notable for the following:    Squamous Epithelial / LPF FEW (*)    Bacteria, UA MANY (*)    All other components within normal limits  URINE CULTURE  LIPASE, BLOOD  POC URINE PREG, ED    Imaging Review No results  found.   EKG Interpretation None      MDM   Final diagnoses:  None   Patient presenting with a chief complaint of suprapubic abdominal pressure and nausea.  UA showing a UTI.  Labs otherwise unremarkable.  Urine pregnancy negative.  No leukocytosis.  Patient is afebrile.  No CVA tenderness.  No signs of Pyelonephritis.  Nausea improved after Zofran.  Patient able to tolerate PO liquids.  Patient stable for discharge.  Patient given IV Rocephin in the ED and given Rx for antibiotic and Zofran.  Patient stable for discharge.  Return precautions given.    Santiago GladHeather Barnard Sharps, PA-C 10/07/13 [phone]

## 2013-10-07 NOTE — ED Provider Notes (Signed)
Medical screening examination/treatment/procedure(s) were performed by non-physician practitioner and as supervising physician I was immediately available for consultation/collaboration.   EKG Interpretation None        Dagmar HaitWilliam Nuri Larmer, MD 10/07/13 2300

## 2013-10-07 NOTE — ED Notes (Addendum)
Vomiting x 2 weeks  Comes and goes Has taken 2 preg tests that are neg  Mostly after she eats and things smell different h/a strated diarrhea x 2 days ago started her on new med 4/26 she stopped it after she started to vomit

## 2013-10-09 LAB — URINE CULTURE: Colony Count: >=100000

## 2013-10-09 NOTE — Discharge Planning (Signed)
[facility] Community Liaison was not able to see the patient, [facility] orange card information and resources will be mailed to the address listed. ° °

## 2013-10-10 ENCOUNTER — Telehealth (HOSPITAL_BASED_OUTPATIENT_CLINIC_OR_DEPARTMENT_OTHER): Payer: Self-pay | Admitting: Emergency Medicine

## 2013-10-10 NOTE — Telephone Encounter (Signed)
Post ED Visit - Positive Culture Follow-up  Culture report reviewed by antimicrobial stewardship pharmacist: []  Wes Dulaney, Pharm.D., BCPS []  Celedonio MiyamotoJeremy Frens, Pharm.D., BCPS []  Georgina PillionElizabeth Martin, Pharm.D., BCPS [x]  BiddleMinh Pham, [address] Rainbow BoulevardPharm.D., BCPS, AAHIVP []  Estella HuskMichelle Turner, Pharm.D., BCPS, AAHIVP []  Harvie JuniorNathan Cope, Pharm.D.  Positive urine culture Treated with Macrobid, organism sensitive to the same and no further patient follow-up is required at this time.  Ranee Peasley 10/10/2013, 11:52 AM

## 2015-03-28 ENCOUNTER — Emergency Department (HOSPITAL_COMMUNITY): Admission: EM | Admit: 2015-03-28 | Discharge: 2015-03-28 | Discharge status: Absent without leave | Payer: Self-pay

## 2019-05-22 ENCOUNTER — Ambulatory Visit: Payer: BC Managed Care – PPO | Attending: Internal Medicine

## 2019-05-22 DIAGNOSIS — Z20822 Contact with and (suspected) exposure to covid-19: Secondary | ICD-10-CM

## 2019-05-23 LAB — NOVEL CORONAVIRUS, NAA: SARS-CoV-2, NAA: NOT DETECTED

## 2020-08-29 ENCOUNTER — Other Ambulatory Visit: Payer: Self-pay

## 2020-08-29 ENCOUNTER — Emergency Department (HOSPITAL_COMMUNITY)
Admission: EM | Admit: 2020-08-29 | Discharge: 2020-08-29 | Disposition: A | Discharge status: Home / Self Care | Payer: Medicaid Other | Attending: Emergency Medicine | Admitting: Emergency Medicine

## 2020-08-29 DIAGNOSIS — R197 Diarrhea, unspecified: Secondary | ICD-10-CM | POA: Insufficient documentation

## 2020-08-29 DIAGNOSIS — R112 Nausea with vomiting, unspecified: Secondary | ICD-10-CM | POA: Insufficient documentation

## 2020-08-29 DIAGNOSIS — R1030 Lower abdominal pain, unspecified: Secondary | ICD-10-CM | POA: Insufficient documentation

## 2020-08-29 DIAGNOSIS — R6883 Chills (without fever): Secondary | ICD-10-CM | POA: Diagnosis not present

## 2020-08-29 DIAGNOSIS — F17210 Nicotine dependence, cigarettes, uncomplicated: Secondary | ICD-10-CM | POA: Diagnosis not present

## 2020-08-29 LAB — URINALYSIS, ROUTINE W REFLEX MICROSCOPIC
Bilirubin Urine: NEGATIVE
Glucose, UA: NEGATIVE mg/dL
Ketones, ur: NEGATIVE mg/dL
Leukocytes,Ua: NEGATIVE
Nitrite: NEGATIVE
Protein, ur: NEGATIVE mg/dL
Specific Gravity, Urine: 1.011 (ref 1.005–1.030)
pH: 5 (ref 5.0–8.0)

## 2020-08-29 LAB — HCG, SERUM, QUALITATIVE: Preg, Serum: NEGATIVE

## 2020-08-29 LAB — COMPREHENSIVE METABOLIC PANEL
ALT: 19 U/L (ref 0–44)
AST: 21 U/L (ref 15–41)
Albumin: 4.1 g/dL (ref 3.5–5.0)
Alkaline Phosphatase: 67 U/L (ref 38–126)
Anion gap: 6 (ref 5–15)
BUN: 11 mg/dL (ref 6–20)
CO2: 26 mmol/L (ref 22–32)
Calcium: 9.2 mg/dL (ref 8.9–10.3)
Chloride: 110 mmol/L (ref 98–111)
Creatinine, Ser: 0.58 mg/dL (ref 0.44–1.00)
GFR, Estimated: >60 mL/min (ref >60)
Glucose, Bld: 111 mg/dL — ABNORMAL HIGH (ref 70–99)
Potassium: 3.8 mmol/L (ref 3.5–5.1)
Sodium: 142 mmol/L (ref 135–145)
Total Bilirubin: <0.1 mg/dL — ABNORMAL LOW (ref 0.3–1.2)
Total Protein: 8.2 g/dL — ABNORMAL HIGH (ref 6.5–8.1)

## 2020-08-29 LAB — CBC WITH DIFFERENTIAL/PLATELET
Abs Immature Granulocytes: 0.03 10*3/uL (ref 0.00–0.07)
Basophils Absolute: 0 10*3/uL (ref 0.0–0.1)
Basophils Relative: 0 %
Eosinophils Absolute: 0.1 10*3/uL (ref 0.0–0.5)
Eosinophils Relative: 2 %
HCT: 38.3 % (ref 36.0–46.0)
Hemoglobin: 12.6 g/dL (ref 12.0–15.0)
Immature Granulocytes: 0 %
Lymphocytes Relative: 21 %
Lymphs Abs: 1.8 10*3/uL (ref 0.7–4.0)
MCH: 29.2 pg (ref 26.0–34.0)
MCHC: 32.9 g/dL (ref 30.0–36.0)
MCV: 88.9 fL (ref 80.0–100.0)
Monocytes Absolute: 0.4 10*3/uL (ref 0.1–1.0)
Monocytes Relative: 5 %
Neutro Abs: 6 10*3/uL (ref 1.7–7.7)
Neutrophils Relative %: 72 %
Platelets: 352 10*3/uL (ref 150–400)
RBC: 4.31 MIL/uL (ref 3.87–5.11)
RDW: 14.1 % (ref 11.5–15.5)
WBC: 8.4 10*3/uL (ref 4.0–10.5)
nRBC: 0 % (ref 0.0–0.2)

## 2020-08-29 LAB — LIPASE, BLOOD: Lipase: 54 U/L — ABNORMAL HIGH (ref 11–51)

## 2020-08-29 MED ORDER — ONDANSETRON 4 MG PO TBDP: 4.0000 mg | ORAL_TABLET | Freq: Three times a day (TID) | ORAL | 0 refills | Status: DC | PRN

## 2020-08-29 MED ORDER — ONDANSETRON HCL 4 MG/2ML IJ SOLN
4.0000 mg | Freq: Once | INTRAMUSCULAR | Status: AC
  Administered 2020-08-29: 4 mg via INTRAVENOUS
  Filled 2020-08-29: qty 2

## 2020-08-29 MED ORDER — SODIUM CHLORIDE 0.9 % IV BOLUS
500.0000 mL | Freq: Once | INTRAVENOUS | Status: AC
  Administered 2020-08-29: 500 mL via INTRAVENOUS

## 2020-08-29 NOTE — ED Notes (Signed)
Report received from Alyssa Mallard, RN.  

## 2020-08-29 NOTE — ED Provider Notes (Addendum)
Physical Exam  BP 118/78 (BP Location: Left Arm)   Pulse 78   Temp 98.7 F (37.1 C) (Oral)   Resp 18   Ht 5\' 7"  (1.702 m)   Wt 93.4 kg   SpO2 97%   BMI 32.26 kg/m   Physical Exam Vitals and nursing note reviewed.  Constitutional:      Appearance: She is well-developed.     Comments: Non toxic in NAD  HENT:     Head: Normocephalic and atraumatic.     Nose: Nose normal.  Eyes:     Conjunctiva/sclera: Conjunctivae normal.  Cardiovascular:     Rate and Rhythm: Normal rate.  Pulmonary:     Effort: Pulmonary effort is normal.  Abdominal:     General: Bowel sounds are normal.     Palpations: Abdomen is soft.     Tenderness: There is no abdominal tenderness.     Comments: No G/R/R. No suprapubic or CVA tenderness. Negative Murphy's and McBurney's. Active BS to lower quadrants.   Musculoskeletal:        General: Normal range of motion.     Cervical back: Normal range of motion.  Skin:    General: Skin is warm and dry.     Capillary Refill: Capillary refill takes less than 2 seconds.  Neurological:     Mental Status: She is alert.  Psychiatric:        Behavior: Behavior normal.     ED Course/Procedures   Clinical Course as of 08/29/20 1031  Sat Aug 29, 2020  Aug 31, 2020 Abdominal cramping.  Sudden lower abdominal pain, nausea, vomiting, diarrhea Suspect viral gastroenteritis Non tender Plan to repeat abdominal exam and likely discharge  [CG]    Clinical Course User Index [CG] 2947, PA-C    Procedures  MDM   Patient evaluated by me twice. Reports significant improvement in symptoms. She is eating subway.  Repeat abdominal exam without tenderness. Tolerating PO. UA shows rare bacteria but otherwise negative. 0-5 RBCs making ureteral stone unlikely especially given diarrhea.  Given improvement in symptoms will discharge with nausea medicine, oral hydration. Plan to discuarge discussed with patient and she is in agreement.  Prescription will be sent to her  desire pharmacy. Urine culture sent although patient denies UTI symptoms to me and previous EDPA.   1120: RN notified me patient now reporting 10/10 pain.  She states no doctor has been her while she has been here.  I evaluated the patient a third time.  . When I enter the room she says "who are you". I clarified I had seen her twice and both times had reported improvement in pain.  She states she remembers me now because "you are the one who forgot my pharmacy".  Patient upset because she had asked for something to drink and no one brought her anything.  Patient changing into her clothes. Attempted to deescalate but patient told me "you are good baby". States she knows the chain of command and knows who to complain to about me. Unclear what transpired since I last saw patient as she had reported resolution of pain and was eating subway the last time I saw her.  Discussed taking tylenol for pain at home. She has rx for zofran. She is not interested in any more medicines in the ER and states she is ready to go.   Liberty Handy, PA-C 08/29/20 1031    10/29/20, PA-C 08/29/20 1122    10/29/20, PA-C 08/29/20  1123    Benjiman Core, MD 08/29/20 1510

## 2020-08-29 NOTE — ED Notes (Signed)
20g IV started in the right AC by IV team. Fluids restarted at this time. VSS. Urine sent.

## 2020-08-29 NOTE — ED Notes (Addendum)
This nurse entered the pt's room to provider her with discharge instructions. Per the pt, "nothing had been explained to her." When this ED nurse asked her if the PA-C had spoken with her, the pt stated, "No." This nurse reviewed her discharge instructions thoroughly with the pt and pt expressed understanding. The pt asked if she had been discharged with pain medication, this nurse reviewed with the pt again that she had been discharged with a prescription for Zofran, sent to her preferred pharmacy and that she could take Tylenol for pain, per the PA-C, Sharen Heck. This nurse reviewed the above information with PA-C Sharen Heck, and per Debarah Crape, the pt stated to her that she "was fine" upon reviewing discharge instructions with her and that pt was sitting upright on stretcher eating a cookie at that time.

## 2020-08-29 NOTE — ED Triage Notes (Signed)
Pt came in with c/o abdominal pain in lower quadrants that started at approx 0330. Pt states she's had multiple episodes of vomiting. Pt also endorses diarrhea.

## 2020-08-29 NOTE — ED Notes (Signed)
IV fluids paused. IV infiltration. IV team consulted

## 2020-08-29 NOTE — ED Notes (Signed)
Per nightshift RN, pt lost peripheral IV access and fluid bolus was paused. D/t pt being a difficult stick, IV team consult has been placed by nightshift RN. Provider notified and aware. No additional orders at this time. Will continue to monitor.

## 2020-08-29 NOTE — Discharge Instructions (Addendum)
You were seen for nausea, vomiting, diarrhea  Labs were ok  You received IVF and medicine for nausea  Stay hydrated, drink 64 oz of liquids a day.  Avoid heavy, greasy, spicy food until you feel 100% better. Use zofran as needed for nausea  Return for worsening or new symptoms

## 2020-08-29 NOTE — ED Provider Notes (Signed)
[facility]-EMERGENCY DEPT Provider Note   CSN: [id number] Arrival date & time: 08/29/20  0536     History Chief Complaint  Patient presents with  . Abdominal Pain    Suzanne Rush is a 40 y.o. female presents to the Emergency Department complaining of acute, persistent nausea vomiting and diarrhea onset around 3:30 AM.  Patient reports she awoke with lower abdominal pain described as cramping and rated as moderate.  She states after several episodes of nonbloody nonbilious emesis and diarrhea without melena her abdominal pain decreased.  She reports she still feels nauseated and her abdomen feels sore.  No known sick contacts.  Denies fever but does report she had some chills while vomiting.  Reports last menstrual cycle was 1 week ago.  EMS reports no persistent vomiting with them.  The history is provided by the patient and medical records. No language interpreter was used.       Past Medical History:  Diagnosis Date  . Amenorrhea   . Infertility   . Kidney stones     There are no problems to display for this patient.   Past Surgical History:  Procedure Laterality Date  . CESAREAN SECTION       OB History   No obstetric history on file.     No family history on file.  Social History   Tobacco Use  . Smoking status: Current Every Day Smoker    Types: Cigarettes  Substance Use Topics  . Alcohol use: Yes    Comment: occasional  . Drug use: No    Home Medications Prior to Admission medications   Medication Sig Start Date End Date Taking? Authorizing Provider  ibuprofen (ADVIL,MOTRIN) 200 MG tablet Take 600 mg by mouth every 6 (six) hours as needed for moderate pain.    [provider]  Multiple Vitamin (MULTIVITAMIN WITH MINERALS) TABS tablet Take 1 tablet by mouth daily.    [provider]  nitrofurantoin, macrocrystal-monohydrate, (MACROBID) 100 MG capsule Take 1 capsule (100 mg total) by mouth 2 (two) times daily.  10/07/13   Santiago Glad, PA-C  ondansetron (ZOFRAN) 4 MG tablet Take 1 tablet (4 mg total) by mouth every 6 (six) hours. 10/07/13   Santiago Glad, PA-C    Allergies    Patient has no known allergies.  Review of Systems   Review of Systems  Constitutional: Positive for chills. Negative for appetite change, diaphoresis, fatigue, fever and unexpected weight change.  HENT: Negative for mouth sores.   Eyes: Negative for visual disturbance.  Respiratory: Negative for cough, chest tightness, shortness of breath and wheezing.   Cardiovascular: Negative for chest pain.  Gastrointestinal: Positive for abdominal pain, diarrhea, nausea and vomiting. Negative for constipation.  Endocrine: Negative for polydipsia, polyphagia and polyuria.  Genitourinary: Negative for dysuria, frequency, hematuria and urgency.  Musculoskeletal: Negative for back pain and neck stiffness.  Skin: Negative for rash.  Allergic/Immunologic: Negative for immunocompromised state.  Neurological: Negative for syncope, light-headedness and headaches.  Hematological: Does not bruise/bleed easily.  Psychiatric/Behavioral: Negative for sleep disturbance. The patient is not nervous/anxious.     Physical Exam Updated Vital Signs BP 116/84 (BP Location: Right Arm)   Pulse 77   Temp 98.3 F (36.8 C) (Oral)   Resp 18   Ht 5\' 7"  (1.702 m)   Wt 93.4 kg   SpO2 96%   BMI 32.26 kg/m   Physical Exam Vitals and nursing note reviewed.  Constitutional:      General: She is  not in acute distress.    Appearance: She is not diaphoretic.  HENT:     Head: Normocephalic.  Eyes:     General: No scleral icterus.    Conjunctiva/sclera: Conjunctivae normal.  Cardiovascular:     Rate and Rhythm: Normal rate and regular rhythm.     Pulses: Normal pulses.          Radial pulses are 2+ on the right side and 2+ on the left side.  Pulmonary:     Effort: No tachypnea, accessory muscle usage, prolonged expiration, respiratory distress  or retractions.     Breath sounds: No stridor.     Comments: Equal chest rise. No increased work of breathing. Abdominal:     General: Bowel sounds are increased. There is no distension.     Palpations: Abdomen is soft.     Tenderness: There is no abdominal tenderness. There is no right CVA tenderness, left CVA tenderness, guarding or rebound.  Musculoskeletal:     Cervical back: Normal range of motion.     Comments: Moves all extremities equally and without difficulty.  Skin:    General: Skin is warm and dry.     Capillary Refill: Capillary refill takes less than 2 seconds.  Neurological:     Mental Status: She is alert.     GCS: GCS eye subscore is 4. GCS verbal subscore is 5. GCS motor subscore is 6.     Comments: Speech is clear and goal oriented.  Psychiatric:        Mood and Affect: Mood normal.     ED Results / Procedures / Treatments   Labs (all labs ordered are listed, but only abnormal results are displayed) Labs Reviewed  CBC WITH DIFFERENTIAL/PLATELET  COMPREHENSIVE METABOLIC PANEL  LIPASE, BLOOD  HCG, SERUM, QUALITATIVE  URINALYSIS, ROUTINE W REFLEX MICROSCOPIC    Procedures Procedures   Medications Ordered in ED Medications  sodium chloride 0.9 % bolus 500 mL (has no administration in time range)  ondansetron (ZOFRAN) injection 4 mg (has no administration in time range)    ED Course  I have reviewed the triage vital signs and the nursing notes.  Pertinent labs & imaging results that were available during my care of the patient were reviewed by me and considered in my medical decision making (see chart for details).    MDM Rules/Calculators/A&P                           Patient presents with lower abdominal pain, nausea vomiting and diarrhea.  Vitals are within normal limits.  Abdomen is soft and nontender.  Suspect gastroenteritis.  Will check labs, give fluids and Zofran and reassess.  6:34 AM At shift change care was transferred to [facility]  who will follow pending studies, re-evaulate and determine disposition.     Final Clinical Impression(s) / ED Diagnoses Final diagnoses:  Nausea vomiting and diarrhea    Rx / DC Orders ED Discharge Orders    None       Renel Ende, Boyd Kerbs 08/29/20 3016    Nira Conn, MD 08/30/20 [phone]

## 2020-08-29 NOTE — ED Notes (Signed)
IV team at bedside 

## 2020-08-29 NOTE — ED Notes (Signed)
This nurse addressed pt's concerns with PA-C Sharen Heck, who entered the pt's room to verify. Per Sharen Heck, PA-C, pt is still medically clear for discharge at this time.

## 2020-08-30 LAB — URINE CULTURE

## 2020-09-01 ENCOUNTER — Ambulatory Visit (HOSPITAL_COMMUNITY)
Admission: EM | Admit: 2020-09-01 | Discharge: 2020-09-01 | Disposition: A | Discharge status: Home / Self Care | Payer: Medicaid Other | Attending: Urgent Care | Admitting: Urgent Care

## 2020-09-01 ENCOUNTER — Encounter (HOSPITAL_COMMUNITY): Payer: Self-pay

## 2020-09-01 ENCOUNTER — Ambulatory Visit (INDEPENDENT_AMBULATORY_CARE_PROVIDER_SITE_OTHER): Payer: Medicaid Other

## 2020-09-01 ENCOUNTER — Other Ambulatory Visit: Payer: Self-pay

## 2020-09-01 DIAGNOSIS — R6889 Other general symptoms and signs: Secondary | ICD-10-CM

## 2020-09-01 DIAGNOSIS — R52 Pain, unspecified: Secondary | ICD-10-CM

## 2020-09-01 DIAGNOSIS — R5381 Other malaise: Secondary | ICD-10-CM | POA: Diagnosis not present

## 2020-09-01 DIAGNOSIS — R5383 Other fatigue: Secondary | ICD-10-CM

## 2020-09-01 LAB — POC INFLUENZA A AND B ANTIGEN (URGENT CARE ONLY)
Influenza A Ag: NEGATIVE
Influenza B Ag: NEGATIVE

## 2020-09-01 MED ORDER — ACETAMINOPHEN 325 MG PO TABS
975.0000 mg | ORAL_TABLET | Freq: Once | ORAL | Status: AC
  Administered 2020-09-01: 975 mg via ORAL

## 2020-09-01 MED ORDER — FLUTICASONE PROPIONATE 50 MCG/ACT NA SUSP: 2.0000 | Freq: Every day | NASAL | 0 refills | Status: DC

## 2020-09-01 MED ORDER — ACETAMINOPHEN 325 MG PO TABS
ORAL_TABLET | ORAL | Status: AC
  Filled 2020-09-01: qty 3

## 2020-09-01 MED ORDER — CETIRIZINE HCL 10 MG PO TABS: 10.0000 mg | ORAL_TABLET | Freq: Every day | ORAL | 0 refills | Status: DC

## 2020-09-01 NOTE — Discharge Instructions (Signed)
We will manage this as a viral illness. For sore throat or cough try using a honey-based tea. Use 3 teaspoons of honey with juice squeezed from half lemon. Place shaved pieces of ginger into 1/2-1 cup of water and warm over stove top. Then mix the ingredients and repeat every 4 hours as needed. Please take ibuprofen 600mg every 6 hours with food alternating with OR taken together with Tylenol 500mg-650mg every 6 hours for throat pain, fevers, aches and pains. Hydrate very well with at least 2 liters of water. Eat light meals such as soups (chicken and noodles, vegetable, chicken and wild rice).  Do not eat foods that you are allergic to.  Taking an antihistamine like Zyrtec, Allegra or Claritin can help against postnasal drainage, sinus congestion.  You can take this together with pseudoephedrine (Sudafed) at a dose of 60 mg 3 times a day or twice daily as needed for the same kind of nasal drip, congestion.  However, limit your use of pseudoephedrine if you have high blood pressure or avoid altogether if you have abnormal heart rhythms, heart condition. 

## 2020-09-01 NOTE — ED Provider Notes (Signed)
[facility]   MRN: [id number] DOB: 06/14/1980  Subjective:   Suzanne Rush is a 40 y.o. female presenting for 4 day history of acute onset persistent and worsening fatigue, weakness, body aches, headaches, nausea, vomiting and diarrhea. Has used ibuprofen intermittently. Denies cough, chest pain, shob. She is a smoker. No history of respiratory disorders.   No current facility-administered medications for this encounter.  Current Outpatient Medications:  .  ibuprofen (ADVIL,MOTRIN) 200 MG tablet, Take 600 mg by mouth every 6 (six) hours as needed for moderate pain., Disp: , Rfl:  .  Multiple Vitamin (MULTIVITAMIN WITH MINERALS) TABS tablet, Take 1 tablet by mouth daily., Disp: , Rfl:  .  nitrofurantoin, macrocrystal-monohydrate, (MACROBID) 100 MG capsule, Take 1 capsule (100 mg total) by mouth 2 (two) times daily., Disp: 10 capsule, Rfl: 0 .  ondansetron (ZOFRAN ODT) 4 MG disintegrating tablet, Take 1 tablet (4 mg total) by mouth every 8 (eight) hours as needed for nausea or vomiting., Disp: 20 tablet, Rfl: 0 .  ondansetron (ZOFRAN) 4 MG tablet, Take 1 tablet (4 mg total) by mouth every 6 (six) hours., Disp: 12 tablet, Rfl: 0   No Known Allergies  Past Medical History:  Diagnosis Date  . Amenorrhea   . Infertility   . Kidney stones      Past Surgical History:  Procedure Laterality Date  . CESAREAN SECTION      History reviewed. No pertinent family history.  Social History   Tobacco Use  . Smoking status: Current Every Day Smoker    Types: Cigarettes  Substance Use Topics  . Alcohol use: Yes    Comment: occasional  . Drug use: No    ROS   Objective:   Vitals: BP 131/89 (BP Location: Left Arm)   Pulse (!) 103   Temp 99.6 F (37.6 C) (Oral)   Resp 18   LMP 08/22/2020   SpO2 100%   Physical Exam Constitutional:      General: She is not in acute distress.    Appearance: Normal appearance. She is well-developed. She is ill-appearing. She is  not toxic-appearing or diaphoretic.  HENT:     Head: Normocephalic and atraumatic.     Right Ear: External ear normal.     Left Ear: External ear normal.     Nose: Nose normal.     Mouth/Throat:     Mouth: Mucous membranes are moist.     Pharynx: Oropharynx is clear.  Eyes:     General: No scleral icterus.       Right eye: No discharge.        Left eye: No discharge.     Extraocular Movements: Extraocular movements intact.     Conjunctiva/sclera: Conjunctivae normal.     Pupils: Pupils are equal, round, and reactive to light.  Cardiovascular:     Rate and Rhythm: Normal rate and regular rhythm.     Pulses: Normal pulses.     Heart sounds: Normal heart sounds. No murmur heard. No friction rub. No gallop.   Pulmonary:     Effort: Pulmonary effort is normal. No respiratory distress.     Breath sounds: No stridor. No wheezing, rhonchi or rales.     Comments: Decreased lung sounds.  Skin:    General: Skin is warm and dry.     Findings: No rash.  Neurological:     Mental Status: She is alert and oriented to person, place, and time.     Cranial Nerves:  No cranial nerve deficit.     Motor: No weakness.     Coordination: Coordination normal.     Gait: Gait normal.     Deep Tendon Reflexes: Reflexes normal.  Psychiatric:        Mood and Affect: Mood normal.        Behavior: Behavior normal.        Thought Content: Thought content normal.        Judgment: Judgment normal.     Results for orders placed or performed during the hospital encounter of 09/01/20 (from the past 24 hour(s))  POC Influenza A & B Ag (Urgent Care)     Status: None   Collection Time: 09/01/20  7:07 PM  Result Value Ref Range   Influenza A Ag NEGATIVE NEGATIVE   Influenza B Ag NEGATIVE NEGATIVE    DG Chest 2 View  Result Date: 09/01/2020 CLINICAL DATA:  Fatigue with body aches EXAM: CHEST - 2 VIEW COMPARISON:  None. FINDINGS: The heart size and mediastinal contours are within normal limits. Both lungs are  clear. The visualized skeletal structures are unremarkable. IMPRESSION: No active cardiopulmonary disease. Electronically Signed   By: Jasmine Pang M.D.   On: 09/01/2020 19:13    Assessment and Plan :   PDMP not reviewed this encounter.  1. Flu-like symptoms   2. Malaise and fatigue     Recommend supportive care for what I suspect is influenza despite the point-of-care testing that was negative.  Patient declined COVID-19 testing.  She was given Tylenol in clinic.  Recommended using ibuprofen in conjunction with this at home.  Provided her worse questions were Zyrtec and Flonase for her postnasal drainage and sinus headaches as well. Counseled patient on potential for adverse effects with medications prescribed/recommended today, ER and return-to-clinic precautions discussed, patient verbalized understanding.    Wallis Bamberg, PA-C 09/01/20 1956

## 2020-09-01 NOTE — ED Triage Notes (Signed)
Pt presents with generalized body aches, headache, vomiting, diarrhea, and chills X 4 days.

## 2021-11-09 IMAGING — DX DG CHEST 2V
2 series · 2 of 2 positions shown · non-contrast
Comparison: None.

CLINICAL DATA: Fatigue with body aches

EXAM:
CHEST - 2 VIEW

[chest pa]
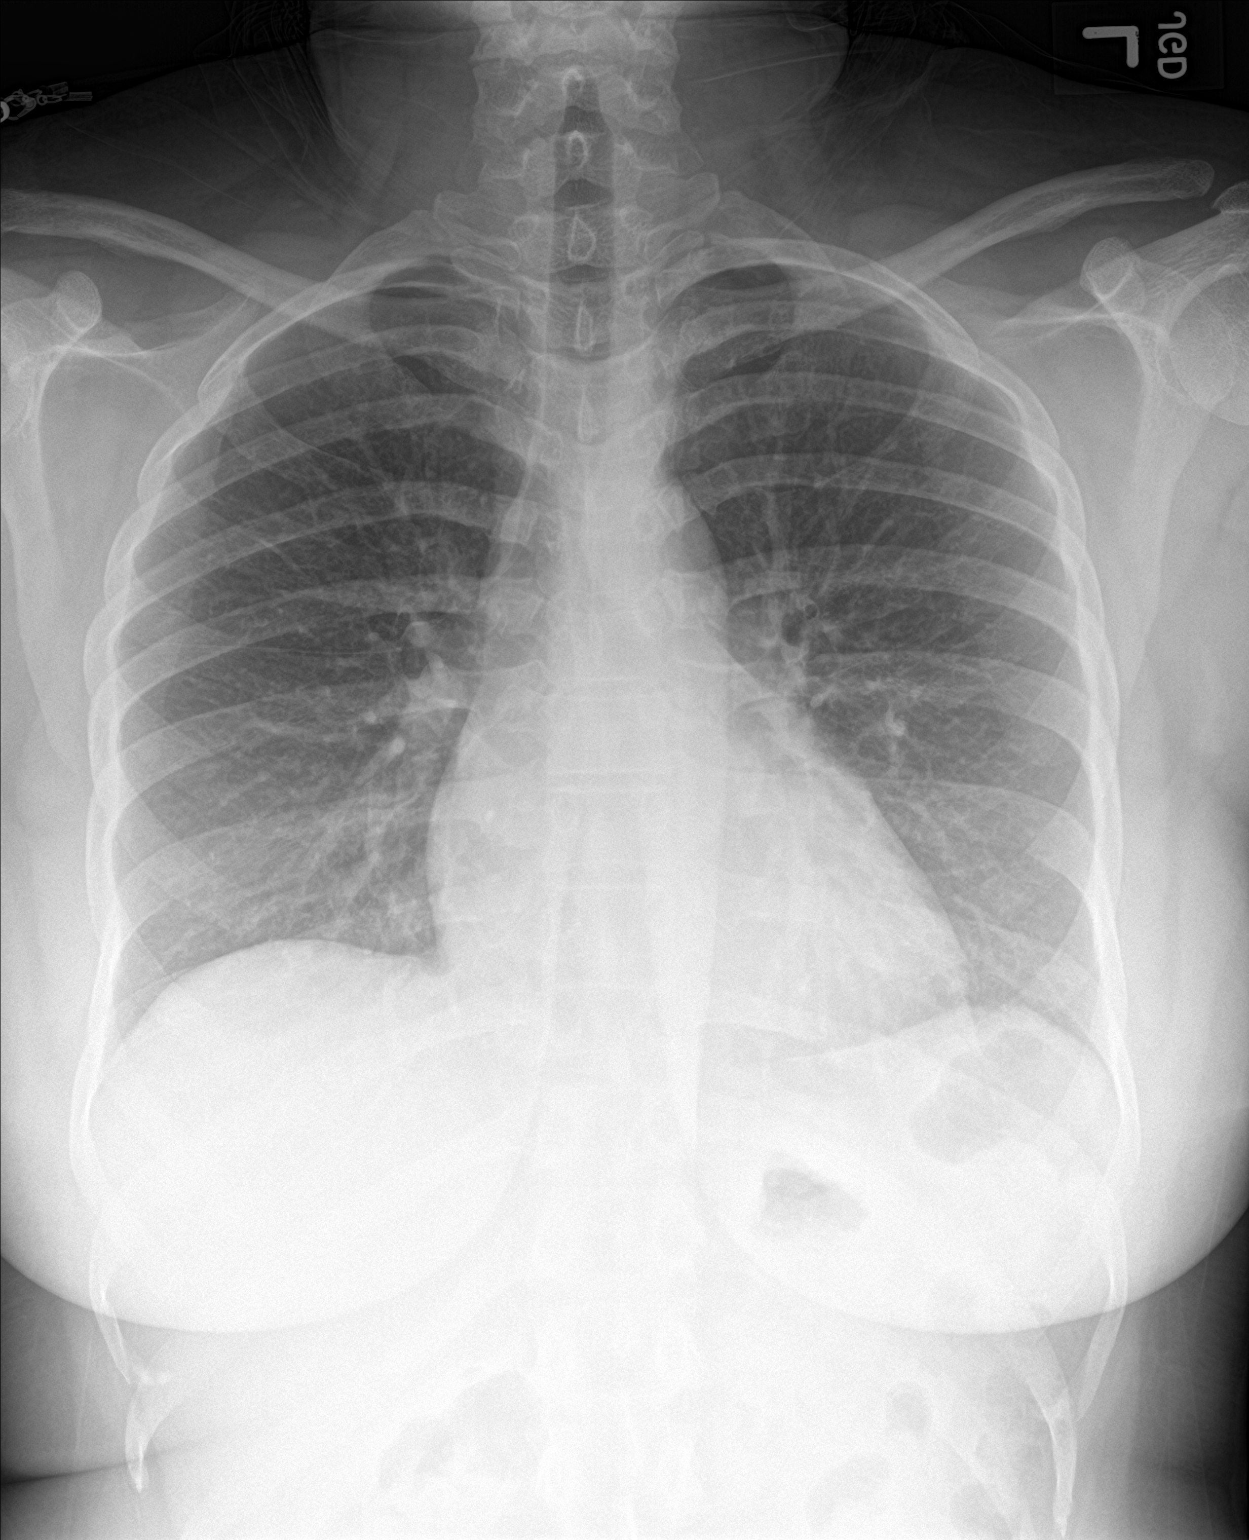

[chest lat]
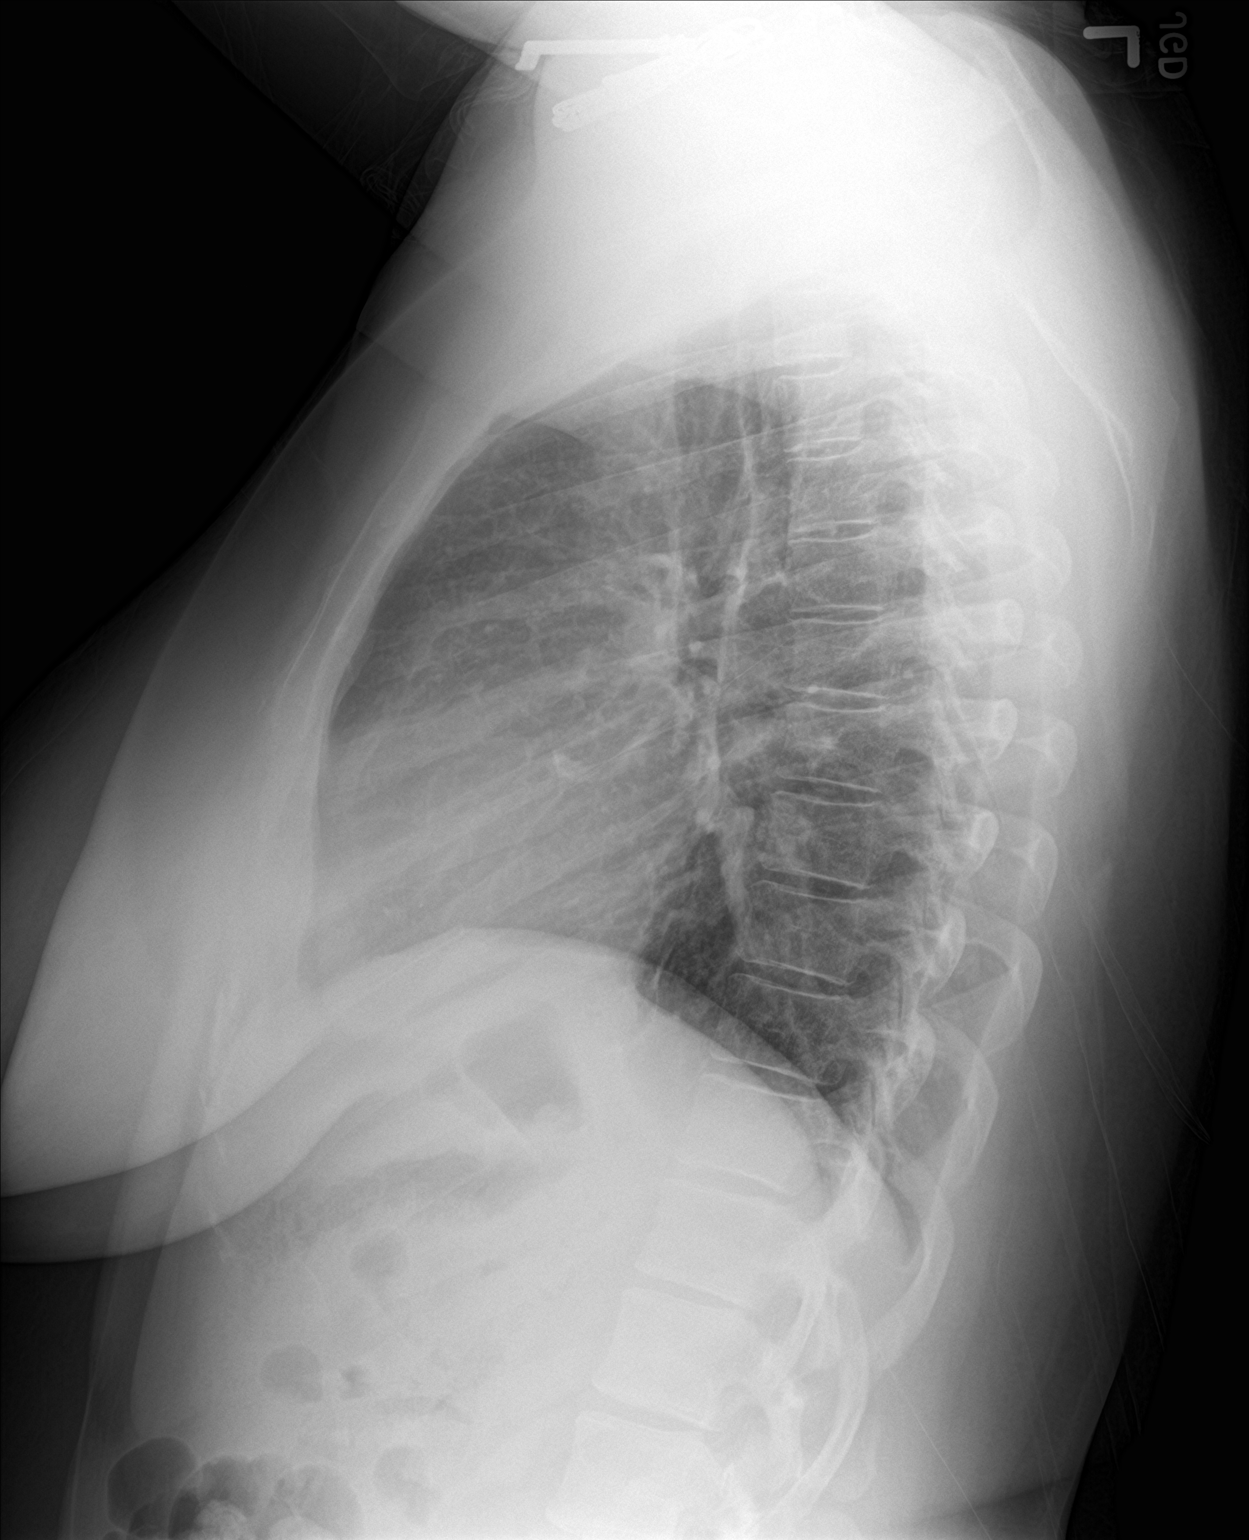

[2 of 2 positions shown; findings below may reference images not displayed]

FINDINGS: The heart size and mediastinal contours are within normal limits.
Both lungs are clear. The visualized skeletal structures are
unremarkable.
IMPRESSION: No active cardiopulmonary disease.

## 2022-06-09 ENCOUNTER — Other Ambulatory Visit: Payer: Self-pay | Admitting: Obstetrics and Gynecology

## 2022-06-09 ENCOUNTER — Ambulatory Visit
Admission: RE | Admit: 2022-06-09 | Discharge: 2022-06-09 | Disposition: A | Discharge status: Home / Self Care | Payer: No Typology Code available for payment source | Source: Ambulatory Visit | Attending: Obstetrics and Gynecology | Admitting: Obstetrics and Gynecology

## 2022-06-09 ENCOUNTER — Encounter: Payer: Self-pay | Admitting: Obstetrics and Gynecology

## 2022-06-09 DIAGNOSIS — R7611 Nonspecific reaction to tuberculin skin test without active tuberculosis: Secondary | ICD-10-CM

## 2022-09-05 ENCOUNTER — Telehealth (HOSPITAL_COMMUNITY): Payer: Self-pay

## 2022-09-05 ENCOUNTER — Encounter (HOSPITAL_COMMUNITY): Payer: Self-pay

## 2022-09-05 ENCOUNTER — Ambulatory Visit (HOSPITAL_COMMUNITY)
Admission: EM | Admit: 2022-09-05 | Discharge: 2022-09-05 | Disposition: A | Discharge status: Home / Self Care | Payer: Medicaid Other | Attending: Internal Medicine | Admitting: Internal Medicine

## 2022-09-05 DIAGNOSIS — J069 Acute upper respiratory infection, unspecified: Secondary | ICD-10-CM | POA: Insufficient documentation

## 2022-09-05 DIAGNOSIS — Z1152 Encounter for screening for COVID-19: Secondary | ICD-10-CM | POA: Insufficient documentation

## 2022-09-05 DIAGNOSIS — J9801 Acute bronchospasm: Secondary | ICD-10-CM | POA: Diagnosis present

## 2022-09-05 DIAGNOSIS — B349 Viral infection, unspecified: Secondary | ICD-10-CM | POA: Diagnosis present

## 2022-09-05 MED ORDER — ALBUTEROL SULFATE HFA 108 (90 BASE) MCG/ACT IN AERS: 1.0000 | INHALATION_SPRAY | Freq: Four times a day (QID) | RESPIRATORY_TRACT | 0 refills | Status: DC | PRN

## 2022-09-05 MED ORDER — BENZONATATE 200 MG PO CAPS: 200.0000 mg | ORAL_CAPSULE | Freq: Three times a day (TID) | ORAL | 0 refills | Status: DC | PRN

## 2022-09-05 MED ORDER — FLUTICASONE PROPIONATE 50 MCG/ACT NA SUSP: 1.0000 | Freq: Every day | NASAL | 0 refills | Status: AC

## 2022-09-05 MED ORDER — CETIRIZINE HCL 10 MG PO TABS: 10.0000 mg | ORAL_TABLET | Freq: Every day | ORAL | 1 refills | Status: AC

## 2022-09-05 MED ORDER — ALBUTEROL SULFATE HFA 108 (90 BASE) MCG/ACT IN AERS: 1.0000 | INHALATION_SPRAY | Freq: Four times a day (QID) | RESPIRATORY_TRACT | 0 refills | Status: AC | PRN

## 2022-09-05 MED ORDER — IBUPROFEN 600 MG PO TABS: 600.0000 mg | ORAL_TABLET | Freq: Four times a day (QID) | ORAL | 0 refills | Status: AC | PRN

## 2022-09-05 MED ORDER — FLUTICASONE PROPIONATE 50 MCG/ACT NA SUSP: 1.0000 | Freq: Every day | NASAL | 0 refills | Status: DC

## 2022-09-05 MED ORDER — CETIRIZINE HCL 10 MG PO TABS: 10.0000 mg | ORAL_TABLET | Freq: Every day | ORAL | 1 refills | Status: DC

## 2022-09-05 MED ORDER — BENZONATATE 200 MG PO CAPS: 200.0000 mg | ORAL_CAPSULE | Freq: Three times a day (TID) | ORAL | 0 refills | Status: AC | PRN

## 2022-09-05 MED ORDER — IBUPROFEN 600 MG PO TABS: 600.0000 mg | ORAL_TABLET | Freq: Four times a day (QID) | ORAL | 0 refills | Status: DC | PRN

## 2022-09-05 MED ORDER — GUAIFENESIN ER 600 MG PO TB12: 600.0000 mg | ORAL_TABLET | Freq: Two times a day (BID) | ORAL | 0 refills | Status: AC

## 2022-09-05 MED ORDER — GUAIFENESIN ER 600 MG PO TB12: 600.0000 mg | ORAL_TABLET | Freq: Two times a day (BID) | ORAL | 0 refills | Status: DC

## 2022-09-05 NOTE — Discharge Instructions (Signed)
Please maintain adequate hydration Please take medications as prescribed Will call you with recommendations if labs are abnormal Return to urgent care if you have persistent or worsening symptoms.

## 2022-09-05 NOTE — ED Triage Notes (Signed)
Pt states cough and nasal congestion x 1 week. Pt has not taking any  medication to help symptoms.

## 2022-09-06 LAB — SARS CORONAVIRUS 2 (TAT 6-24 HRS): SARS Coronavirus 2: NEGATIVE

## 2022-09-09 NOTE — ED Provider Notes (Signed)
[facility]    CSN: [id number] Arrival date & time: 09/05/22  1932      History   Chief Complaint Chief Complaint  Patient presents with   Facial Pain   Pruritis   Cough    HPI Suzanne Rush is a 42 y.o. female comes to the urgent care with 1 week history of nasal congestion and nonproductive cough.  Symptoms started a week ago and has been persistent.  Cough is without any sputum production.  She complains of itchy nose and some facial pain.  Patient is complaining of generalized body aches.  She has chills but denies any fever.  No purulent nasal drainage.  No headaches.  Patient endorses exposure to sick contacts at work.  No shortness of breath, chest tightness or wheezing.  Patient is a smoker and has over 10-pack-year history of smoking.  She denies any nausea, vomiting or diarrhea.  No fever or chills.  No history of seasonal allergies.Marland Kitchen   HPI  Past Medical History:  Diagnosis Date   Amenorrhea    Infertility    Kidney stones     There are no problems to display for this patient.   Past Surgical History:  Procedure Laterality Date   CESAREAN SECTION      OB History   No obstetric history on file.      Home Medications    Prior to Admission medications   Medication Sig Start Date End Date Taking? Authorizing Provider  albuterol (VENTOLIN HFA) 108 (90 Base) MCG/ACT inhaler Inhale 1-2 puffs into the lungs every 6 (six) hours as needed for wheezing or shortness of breath. 09/05/22   Vian Fluegel, Britta Mccreedy, MD  benzonatate (TESSALON) 200 MG capsule Take 1 capsule (200 mg total) by mouth 3 (three) times daily as needed for cough. 09/05/22   Merrilee Jansky, MD  cetirizine (ZYRTEC ALLERGY) 10 MG tablet Take 1 tablet (10 mg total) by mouth daily. 09/05/22   Merrilee Jansky, MD  fluticasone (FLONASE) 50 MCG/ACT nasal spray Place 1 spray into both nostrils daily. 09/05/22   Merrilee Jansky, MD  guaiFENesin (MUCINEX) 600 MG 12 hr tablet Take 1 tablet (600 mg  total) by mouth 2 (two) times daily for 10 days. 09/05/22 09/15/22  Merrilee Jansky, MD  ibuprofen (ADVIL) 600 MG tablet Take 1 tablet (600 mg total) by mouth every 6 (six) hours as needed. 09/05/22   Kissa Campoy, Britta Mccreedy, MD  ondansetron (ZOFRAN) 4 MG tablet Take 1 tablet (4 mg total) by mouth every 6 (six) hours. 10/07/13   Santiago Glad, PA-C    Family History History reviewed. No pertinent family history.  Social History Social History   Tobacco Use   Smoking status: Every Day    Types: Cigarettes  Substance Use Topics   Alcohol use: Yes    Comment: occasional   Drug use: No     Allergies   Patient has no known allergies.   Review of Systems Review of Systems As per HPI  Physical Exam Triage Vital Signs ED Triage Vitals [09/05/22 2002]  Enc Vitals Group     BP (!) 149/102     Pulse Rate 87     Resp 18     Temp 98.1 F (36.7 C)     Temp Source Oral     SpO2 94 %     Weight      Height      Head Circumference      Peak Flow  Pain Score      Pain Loc      Pain Edu?      Excl. in GC?    No data found.  Updated Vital Signs BP (!) 149/102 (BP Location: Left Arm)   Pulse 87   Temp 98.1 F (36.7 C) (Oral)   Resp 18   LMP 08/15/2022   SpO2 94%   Visual Acuity Right Eye Distance:   Left Eye Distance:   Bilateral Distance:    Right Eye Near:   Left Eye Near:    Bilateral Near:     Physical Exam Vitals and nursing note reviewed.  Constitutional:      General: She is not in acute distress.    Appearance: She is not ill-appearing.  HENT:     Right Ear: Tympanic membrane normal.     Left Ear: Tympanic membrane normal.     Mouth/Throat:     Mouth: Mucous membranes are moist.  Cardiovascular:     Rate and Rhythm: Normal rate and regular rhythm.  Pulmonary:     Effort: Pulmonary effort is normal.     Breath sounds: Wheezing present.  Abdominal:     General: Bowel sounds are normal.     Palpations: Abdomen is soft.  Neurological:     Mental  Status: She is alert.      UC Treatments / Results  Labs (all labs ordered are listed, but only abnormal results are displayed) Labs Reviewed  SARS CORONAVIRUS 2 (TAT 6-24 HRS)    EKG   Radiology No results found.  Procedures Procedures (including critical care time)  Medications Ordered in UC Medications - No data to display  Initial Impression / Assessment and Plan / UC Course  I have reviewed the triage vital signs and the nursing notes.  Pertinent labs & imaging results that were available during my care of the patient were reviewed by me and considered in my medical decision making (see chart for details).     1.  Viral URI with cough: COVID-19 PCR test has been sent Tessalon Perles as needed for cough Mucinex twice daily Fluticasone nasal spray Ibuprofen as needed for body aches. Maintain adequate oral fluid intake  2.  Acute bronchospasm: Albuterol inhaler Other medications as above Return precautions given No indication for steroids  3.  Nicotine dependence: Smoke cessation advised.  Patient is in the precontemplative stage of smoke cessation.  Smoke cessation counseling is less than 10 minutes Final Clinical Impressions(s) / UC Diagnoses   Final diagnoses:  Viral URI with cough  Acute bronchospasm due to viral infection     Discharge Instructions      Please maintain adequate hydration Please take medications as prescribed Will call you with recommendations if labs are abnormal Return to urgent care if you have persistent or worsening symptoms.   ED Prescriptions     Medication Sig Dispense Auth. Provider   fluticasone (FLONASE) 50 MCG/ACT nasal spray Place 1 spray into both nostrils daily. 16 g Carlina Derks, Britta Mccreedy, MD   albuterol (VENTOLIN HFA) 108 (90 Base) MCG/ACT inhaler Inhale 1-2 puffs into the lungs every 6 (six) hours as needed for wheezing or shortness of breath. 6.7 g Merrilee Jansky, MD   guaiFENesin (MUCINEX) 600 MG 12 hr  tablet Take 1 tablet (600 mg total) by mouth 2 (two) times daily for 10 days. 20 tablet Keagan Brislin, Britta Mccreedy, MD   cetirizine (ZYRTEC ALLERGY) 10 MG tablet Take 1 tablet (10 mg total) by mouth  daily. 30 tablet Sendy Pluta, Britta Mccreedy, MD   benzonatate (TESSALON) 200 MG capsule Take 1 capsule (200 mg total) by mouth 3 (three) times daily as needed for cough. 21 capsule Jamir Rone, Britta Mccreedy, MD   ibuprofen (ADVIL) 600 MG tablet Take 1 tablet (600 mg total) by mouth every 6 (six) hours as needed. 30 tablet Marlayna Bannister, Britta Mccreedy, MD      PDMP not reviewed this encounter.   Merrilee Jansky, MD 09/09/22 [phone]

## 2023-05-13 ENCOUNTER — Ambulatory Visit: Payer: Self-pay

## 2023-07-22 ENCOUNTER — Emergency Department (HOSPITAL_COMMUNITY)
Admission: EM | Admit: 2023-07-22 | Discharge: 2023-07-22 | Disposition: A | Discharge status: Home / Self Care | Attending: Emergency Medicine | Admitting: Emergency Medicine

## 2023-07-22 ENCOUNTER — Emergency Department (HOSPITAL_COMMUNITY)

## 2023-07-22 ENCOUNTER — Other Ambulatory Visit: Payer: Self-pay

## 2023-07-22 ENCOUNTER — Encounter (HOSPITAL_COMMUNITY): Payer: Self-pay

## 2023-07-22 DIAGNOSIS — S39012A Strain of muscle, fascia and tendon of lower back, initial encounter: Secondary | ICD-10-CM | POA: Diagnosis not present

## 2023-07-22 DIAGNOSIS — M549 Dorsalgia, unspecified: Secondary | ICD-10-CM | POA: Diagnosis present

## 2023-07-22 DIAGNOSIS — Y92410 Unspecified street and highway as the place of occurrence of the external cause: Secondary | ICD-10-CM | POA: Diagnosis not present

## 2023-07-22 DIAGNOSIS — S29012A Strain of muscle and tendon of back wall of thorax, initial encounter: Secondary | ICD-10-CM | POA: Diagnosis not present

## 2023-07-22 LAB — HCG, SERUM, QUALITATIVE: Preg, Serum: NEGATIVE

## 2023-07-22 MED ORDER — IBUPROFEN 400 MG PO TABS
600.0000 mg | ORAL_TABLET | Freq: Once | ORAL | Status: AC
  Administered 2023-07-22: 600 mg via ORAL
  Filled 2023-07-22: qty 1
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
## 2023-07-23 ENCOUNTER — Ambulatory Visit (HOSPITAL_COMMUNITY)
Admission: EM | Admit: 2023-07-23 | Discharge: 2023-07-23 | Disposition: A | Discharge status: Home / Self Care | Payer: Self-pay | Attending: Internal Medicine | Admitting: Internal Medicine

## 2023-07-23 ENCOUNTER — Encounter (HOSPITAL_COMMUNITY): Payer: Self-pay | Admitting: Emergency Medicine

## 2023-07-23 DIAGNOSIS — S39012D Strain of muscle, fascia and tendon of lower back, subsequent encounter: Secondary | ICD-10-CM | POA: Diagnosis not present

## 2023-07-23 DIAGNOSIS — M62838 Other muscle spasm: Secondary | ICD-10-CM

## 2023-07-23 MED ORDER — KETOROLAC TROMETHAMINE 30 MG/ML IJ SOLN
INTRAMUSCULAR | Status: AC
  Filled 2023-07-23: qty 1

## 2023-07-23 MED ORDER — DEXAMETHASONE SODIUM PHOSPHATE 10 MG/ML IJ SOLN
10.0000 mg | Freq: Once | INTRAMUSCULAR | Status: AC
  Administered 2023-07-23: 10 mg via INTRAMUSCULAR

## 2023-07-23 MED ORDER — CYCLOBENZAPRINE HCL 10 MG PO TABS: 10.0000 mg | ORAL_TABLET | Freq: Two times a day (BID) | ORAL | 0 refills | Status: AC | PRN

## 2023-07-23 MED ORDER — DEXAMETHASONE SODIUM PHOSPHATE 10 MG/ML IJ SOLN
INTRAMUSCULAR | Status: AC
  Filled 2023-07-23: qty 1

## 2023-07-23 MED ORDER — PREDNISONE 20 MG PO TABS: 40.0000 mg | ORAL_TABLET | Freq: Every day | ORAL | 0 refills | Status: AC

## 2023-07-23 MED ORDER — KETOROLAC TROMETHAMINE 30 MG/ML IJ SOLN
30.0000 mg | Freq: Once | INTRAMUSCULAR | Status: AC
  Administered 2023-07-23: 30 mg via INTRAMUSCULAR
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
## 2023-10-29 ENCOUNTER — Encounter (HOSPITAL_COMMUNITY): Payer: Self-pay | Admitting: *Deleted

## 2023-10-29 ENCOUNTER — Emergency Department (HOSPITAL_COMMUNITY)
Admission: EM | Admit: 2023-10-29 | Discharge: 2023-10-29 | Disposition: A | Discharge status: Home / Self Care | Attending: Emergency Medicine | Admitting: Emergency Medicine

## 2023-10-29 ENCOUNTER — Other Ambulatory Visit: Payer: Self-pay

## 2023-10-29 DIAGNOSIS — R112 Nausea with vomiting, unspecified: Secondary | ICD-10-CM | POA: Insufficient documentation

## 2023-10-29 DIAGNOSIS — R197 Diarrhea, unspecified: Secondary | ICD-10-CM | POA: Insufficient documentation

## 2023-10-29 LAB — URINALYSIS, ROUTINE W REFLEX MICROSCOPIC
Bilirubin Urine: NEGATIVE
Glucose, UA: NEGATIVE mg/dL
Ketones, ur: NEGATIVE mg/dL
Leukocytes,Ua: NEGATIVE
Nitrite: NEGATIVE
Protein, ur: NEGATIVE mg/dL
Specific Gravity, Urine: 1.01 (ref 1.005–1.030)
pH: 5 (ref 5.0–8.0)

## 2023-10-29 LAB — CBC
HCT: 36 % (ref 36.0–46.0)
Hemoglobin: 12.1 g/dL (ref 12.0–15.0)
MCH: 28.5 pg (ref 26.0–34.0)
MCHC: 33.6 g/dL (ref 30.0–36.0)
MCV: 84.9 fL (ref 80.0–100.0)
Platelets: 396 10*3/uL (ref 150–400)
RBC: 4.24 MIL/uL (ref 3.87–5.11)
RDW: 14.7 % (ref 11.5–15.5)
WBC: 13 10*3/uL — ABNORMAL HIGH (ref 4.0–10.5)
nRBC: 0 % (ref 0.0–0.2)

## 2023-10-29 LAB — COMPREHENSIVE METABOLIC PANEL WITH GFR
ALT: 17 U/L (ref 0–44)
AST: 20 U/L (ref 15–41)
Albumin: 3.7 g/dL (ref 3.5–5.0)
Alkaline Phosphatase: 64 U/L (ref 38–126)
Anion gap: 11 (ref 5–15)
BUN: 15 mg/dL (ref 6–20)
CO2: 23 mmol/L (ref 22–32)
Calcium: 8.8 mg/dL — ABNORMAL LOW (ref 8.9–10.3)
Chloride: 103 mmol/L (ref 98–111)
Creatinine, Ser: 0.62 mg/dL (ref 0.44–1.00)
GFR, Estimated: >60 mL/min (ref >60)
Glucose, Bld: 104 mg/dL — ABNORMAL HIGH (ref 70–99)
Potassium: 3.7 mmol/L (ref 3.5–5.1)
Sodium: 137 mmol/L (ref 135–145)
Total Bilirubin: 0.3 mg/dL (ref 0.0–1.2)
Total Protein: 7.4 g/dL (ref 6.5–8.1)

## 2023-10-29 LAB — LIPASE, BLOOD: Lipase: 49 U/L (ref 11–51)

## 2023-10-29 LAB — HCG, SERUM, QUALITATIVE: Preg, Serum: NEGATIVE

## 2023-10-29 MED ORDER — ALUM & MAG HYDROXIDE-SIMETH 200-200-20 MG/5ML PO SUSP
30.0000 mL | Freq: Once | ORAL | Status: AC
  Administered 2023-10-29: 30 mL via ORAL
  Filled 2023-10-29: qty 30

## 2023-10-29 MED ORDER — FAMOTIDINE IN NACL 20-0.9 MG/50ML-% IV SOLN
20.0000 mg | Freq: Once | INTRAVENOUS | Status: AC
  Administered 2023-10-29: 20 mg via INTRAVENOUS
  Filled 2023-10-29: qty 50

## 2023-10-29 MED ORDER — CEPHALEXIN 500 MG PO CAPS: 500.0000 mg | ORAL_CAPSULE | Freq: Two times a day (BID) | ORAL | 0 refills | Status: AC

## 2023-10-29 MED ORDER — LACTATED RINGERS IV BOLUS
1000.0000 mL | Freq: Once | INTRAVENOUS | Status: AC
  Administered 2023-10-29: 1000 mL via INTRAVENOUS

## 2023-10-29 MED ORDER — ONDANSETRON HCL 4 MG/2ML IJ SOLN
4.0000 mg | Freq: Once | INTRAMUSCULAR | Status: AC
  Administered 2023-10-29: 4 mg via INTRAVENOUS
  Filled 2023-10-29: qty 2

## 2023-10-29 MED ORDER — FAMOTIDINE 20 MG PO TABS: 20.0000 mg | ORAL_TABLET | Freq: Two times a day (BID) | ORAL | 0 refills | Status: AC

## 2023-10-29 MED ORDER — ONDANSETRON 4 MG PO TBDP: 4.0000 mg | ORAL_TABLET | Freq: Three times a day (TID) | ORAL | 0 refills | Status: AC | PRN

## 2023-10-29 NOTE — ED Provider Notes (Signed)
 [facility] Emergency Department Provider Note MRN:  [id number]  Arrival date & time: 10/29/23     Chief Complaint   Weakness   History of Present Illness   Suzanne Rush is a 43 y.o. year-old female presents to the ED with chief complaint of generalized fatigue, nausea, vomiting, and diarrhea.  She states that she has been feeling bad for the past couple of days.  Nausea, vomiting, diarrhea worsened today.  She reports associated acid reflux.  She denies any focal abdominal pain.  Denies any fever.  Denies any successful treatments prior to arrival.  Nothing makes her symptoms better or worse  History provided by patient.   Review of Systems  Pertinent positive and negative review of systems noted in HPI.    Physical Exam   Vitals:   10/29/23 0042  BP: 128/87  Pulse: 73  Resp: 19  Temp: 98.4 F (36.9 C)  SpO2: 95%    CONSTITUTIONAL:  non toxic-appearing, NAD NEURO:  Alert and oriented x 3, CN 3-12 grossly intact EYES:  eyes equal and reactive ENT/NECK:  Supple, no stridor  CARDIO:  normal rate, regular rhythm, appears well-perfused  PULM:  No respiratory distress, CTAB GI/GU:  non-distended, no focal abdominal tenderness MSK/SPINE:  No gross deformities, no edema, moves all extremities  SKIN:  no rash, atraumatic   *Additional and/or pertinent findings included in MDM below  Diagnostic and Interventional Summary    EKG Interpretation Date/Time:    Ventricular Rate:    PR Interval:    QRS Duration:    QT Interval:    QTC Calculation:   R Axis:      Text Interpretation:         Labs Reviewed  COMPREHENSIVE METABOLIC PANEL WITH GFR - Abnormal; Notable for the following components:      Result Value   Glucose, Bld 104 (*)    Calcium 8.8 (*)    All other components within normal limits  CBC - Abnormal; Notable for the following components:   WBC 13.0 (*)    All other components within normal limits  URINALYSIS, ROUTINE W REFLEX  MICROSCOPIC - Abnormal; Notable for the following components:   Color, Urine STRAW (*)    Hgb urine dipstick MODERATE (*)    Bacteria, UA RARE (*)    All other components within normal limits  LIPASE, BLOOD  HCG, SERUM, QUALITATIVE    No orders to display    Medications  lactated ringers bolus 1,000 mL (1,000 mLs Intravenous New Bag/Given 10/29/23 0459)  famotidine (PEPCID) IVPB 20 mg premix (20 mg Intravenous New Bag/Given 10/29/23 0502)  ondansetron  (ZOFRAN ) injection 4 mg (4 mg Intravenous Given 10/29/23 0457)  alum & mag hydroxide-simeth (MAALOX/MYLANTA) 200-200-20 MG/5ML suspension 30 mL (30 mLs Oral Given 10/29/23 0459)     Procedures  /  Critical Care Procedures  ED Course and Medical Decision Making  I have reviewed the triage vital signs, the nursing notes, and pertinent available records from the EMR.  Social Determinants Affecting Complexity of Care: Patient has no clinically significant social determinants affecting this chief complaint..   ED Course:    Medical Decision Making 0 patient here with nausea, vomiting, and diarrhea.  She has been having fatigue for the past couple of days, the nausea, vomiting, and diarrhea started today.  She denies any fevers or chills.  Denies any pain.  Will treat symptoms with IV fluid, Pepcid, Zofran , GI cocktail.  She states that she has had some  acid reflux.  Urinalysis slightly abnormal, this could be contributing, will cover with Keflex .  Patient symptoms are improved after treatment in the ED.  She feels comfortable with discharge home.  Will prescribe Zofran  Pepcid.  We discussed strict return precautions.  Her abdomen remains soft, I do not think that she requires any emergent imaging.  Amount and/or Complexity of Data Reviewed Labs: ordered.  Risk OTC drugs. Prescription drug management.         Consultants: No consultations were needed in caring for this patient.   Treatment and Plan: I considered admission due  to patient's initial presentation, but after considering the examination and diagnostic results, patient will not require admission and can be discharged with outpatient follow-up.    Final Clinical Impressions(s) / ED Diagnoses     ICD-10-CM   1. Nausea vomiting and diarrhea  R11.2    R19.7       ED Discharge Orders          Ordered    famotidine (PEPCID) 20 MG tablet  2 times daily        10/29/23 0617    cephALEXin  (KEFLEX ) 500 MG capsule  2 times daily        10/29/23 0617    ondansetron  (ZOFRAN -ODT) 4 MG disintegrating tablet  Every 8 hours PRN        10/29/23 0617              Discharge Instructions Discussed with and Provided to Patient:     Discharge Instructions      Your symptoms will likely last another 24-48 hours.  Stay hydrated.  Return for new or worsening symptoms.  Take medications as prescribed.     Sherel Dikes, PA-C 10/29/23 1610    Eve Hinders, MD 10/29/23 [phone]

## 2023-10-29 NOTE — Discharge Instructions (Signed)
 Your symptoms will likely last another 24-48 hours.  Stay hydrated.  Return for new or worsening symptoms.  Take medications as prescribed.
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
